# Patient Record
Sex: Female | Born: 1935 | Race: White | Hispanic: No | Marital: Married | Smoking: Former smoker
Health system: Southern US, Community
[De-identification: ages and names within clinical notes are randomized; demographics above are authoritative.]

## PROBLEM LIST (undated history)

## (undated) DIAGNOSIS — I519 Heart disease, unspecified: Secondary | ICD-10-CM

## (undated) DIAGNOSIS — M1990 Unspecified osteoarthritis, unspecified site: Secondary | ICD-10-CM

## (undated) DIAGNOSIS — I219 Acute myocardial infarction, unspecified: Secondary | ICD-10-CM

## (undated) DIAGNOSIS — C50311 Malignant neoplasm of lower-inner quadrant of right female breast: Secondary | ICD-10-CM

## (undated) DIAGNOSIS — E119 Type 2 diabetes mellitus without complications: Secondary | ICD-10-CM

## (undated) DIAGNOSIS — N3090 Cystitis, unspecified without hematuria: Secondary | ICD-10-CM

## (undated) DIAGNOSIS — I10 Essential (primary) hypertension: Secondary | ICD-10-CM

## (undated) DIAGNOSIS — I209 Angina pectoris, unspecified: Secondary | ICD-10-CM

## (undated) DIAGNOSIS — C801 Malignant (primary) neoplasm, unspecified: Secondary | ICD-10-CM

## (undated) DIAGNOSIS — J45909 Unspecified asthma, uncomplicated: Secondary | ICD-10-CM

## (undated) HISTORY — DX: Malignant (primary) neoplasm, unspecified: C80.1

## (undated) HISTORY — DX: Unspecified osteoarthritis, unspecified site: M19.90

## (undated) HISTORY — DX: Unspecified asthma, uncomplicated: J45.909

## (undated) HISTORY — DX: Malignant neoplasm of lower-inner quadrant of right female breast: C50.311

## (undated) HISTORY — DX: Cystitis, unspecified without hematuria: N30.90

## (undated) HISTORY — DX: Heart disease, unspecified: I51.9

## (undated) HISTORY — PX: ABDOMINAL HYSTERECTOMY: SHX81

## (undated) HISTORY — DX: Acute myocardial infarction, unspecified: I21.9

## (undated) HISTORY — DX: Angina pectoris, unspecified: I20.9

## (undated) HISTORY — DX: Essential (primary) hypertension: I10

## (undated) HISTORY — DX: Type 2 diabetes mellitus without complications: E11.9

---

## 1999-04-05 HISTORY — PX: COLONOSCOPY: SHX174

## 2003-03-12 ENCOUNTER — Other Ambulatory Visit: Payer: Self-pay

## 2003-03-14 ENCOUNTER — Other Ambulatory Visit: Payer: Self-pay

## 2003-03-16 ENCOUNTER — Other Ambulatory Visit: Payer: Self-pay

## 2003-11-10 ENCOUNTER — Other Ambulatory Visit: Payer: Self-pay

## 2004-01-19 ENCOUNTER — Other Ambulatory Visit: Payer: Self-pay

## 2004-01-19 ENCOUNTER — Inpatient Hospital Stay: Payer: Self-pay | Admitting: Cardiology

## 2004-04-04 ENCOUNTER — Emergency Department: Payer: Self-pay | Admitting: Emergency Medicine

## 2004-04-04 DIAGNOSIS — I519 Heart disease, unspecified: Secondary | ICD-10-CM

## 2004-04-04 HISTORY — PX: AORTIC VALVE SURGERY: SHX549

## 2004-04-04 HISTORY — DX: Heart disease, unspecified: I51.9

## 2004-04-04 HISTORY — PX: CORONARY ANGIOPLASTY WITH STENT PLACEMENT: SHX49

## 2004-12-23 ENCOUNTER — Inpatient Hospital Stay: Payer: Self-pay | Admitting: Endocrinology

## 2004-12-23 ENCOUNTER — Other Ambulatory Visit: Payer: Self-pay

## 2004-12-24 ENCOUNTER — Other Ambulatory Visit: Payer: Self-pay

## 2005-07-17 ENCOUNTER — Other Ambulatory Visit: Payer: Self-pay

## 2005-07-17 ENCOUNTER — Emergency Department: Payer: Self-pay | Admitting: Unknown Physician Specialty

## 2005-09-15 ENCOUNTER — Emergency Department: Payer: Self-pay | Admitting: Emergency Medicine

## 2005-09-15 ENCOUNTER — Other Ambulatory Visit: Payer: Self-pay

## 2005-12-24 ENCOUNTER — Emergency Department: Payer: Self-pay | Admitting: Emergency Medicine

## 2005-12-24 ENCOUNTER — Other Ambulatory Visit: Payer: Self-pay

## 2006-01-01 ENCOUNTER — Emergency Department: Payer: Self-pay | Admitting: Emergency Medicine

## 2006-01-08 ENCOUNTER — Inpatient Hospital Stay: Payer: Self-pay | Admitting: Endocrinology

## 2006-01-23 ENCOUNTER — Ambulatory Visit: Payer: Self-pay | Admitting: Urology

## 2006-03-05 ENCOUNTER — Observation Stay: Payer: Self-pay | Admitting: Specialist

## 2006-03-05 ENCOUNTER — Other Ambulatory Visit: Payer: Self-pay

## 2007-01-01 ENCOUNTER — Ambulatory Visit: Payer: Self-pay

## 2007-01-13 ENCOUNTER — Emergency Department: Payer: Self-pay

## 2007-03-06 ENCOUNTER — Ambulatory Visit: Payer: Self-pay | Admitting: Gastroenterology

## 2007-03-20 ENCOUNTER — Observation Stay: Payer: Self-pay | Admitting: Gastroenterology

## 2007-04-05 DIAGNOSIS — I209 Angina pectoris, unspecified: Secondary | ICD-10-CM

## 2007-04-05 HISTORY — DX: Angina pectoris, unspecified: I20.9

## 2007-10-06 IMAGING — CT CT CHEST W/ CM
2 series · 15 of 31 positions shown, 19 images · IV contrast (APPLIED)
Comparison: none

REASON FOR EXAM: Pain in ribs
COMMENTS:  LMP: Post-Menopausal

PROCEDURE:     CT  - CT CHEST (FOR PE) W  - July 17, 2005  [DATE]
RESULT:     The patient is experiencing pain in ribs.
TECHNIQUE: CT scan of chest is performed with contrast.
Comparison is made to prior chest CT of 12/24/2004.

[Series 4: soft tissue · axial · 0.67mm/px · z∈[+562,+601]mm · 2 of 87 slices shown]
[im 7/87  mediastinal]
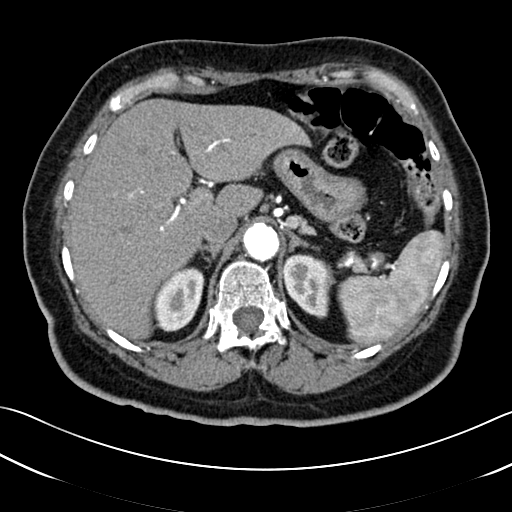
[im 20/87  mediastinal]
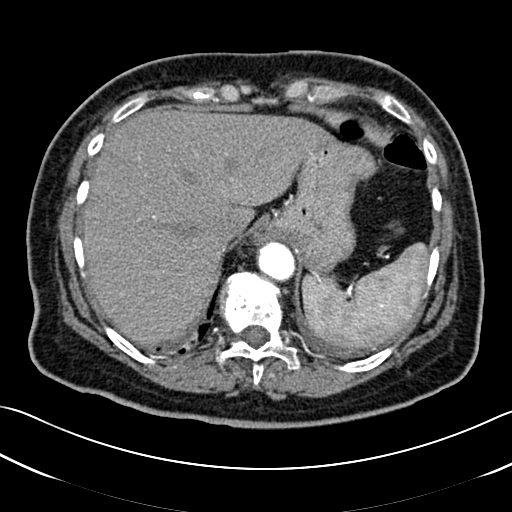

[Series 5: lung windows · axial · 0.67mm/px · z∈[+568,+781]mm · 13 of 85 slices shown, 17 images]
[im 7/85  mediastinal]
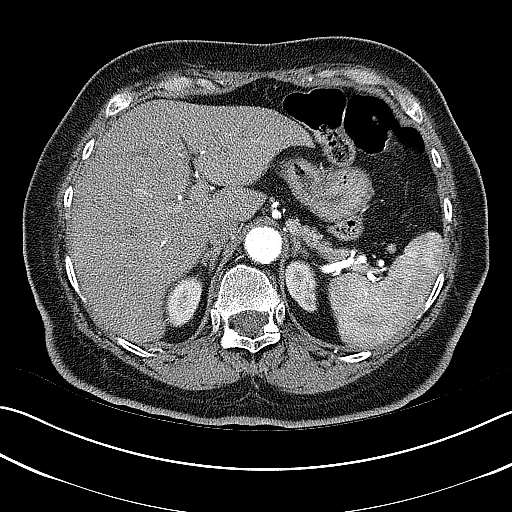
[im 7/85  lung]
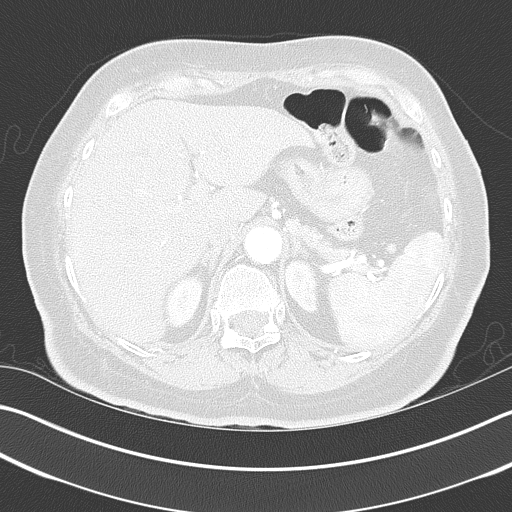
[im 13/85  lung]
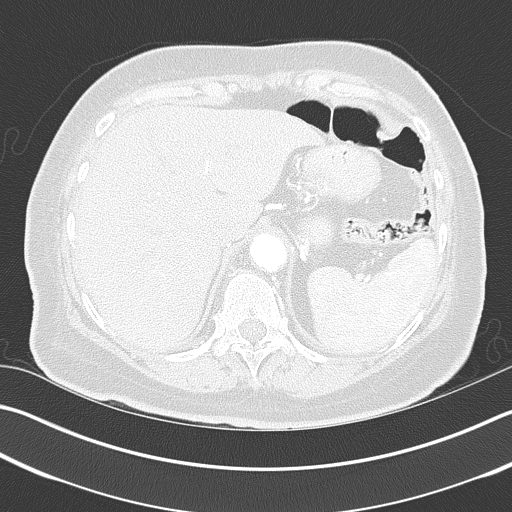
[im 20/85  lung]
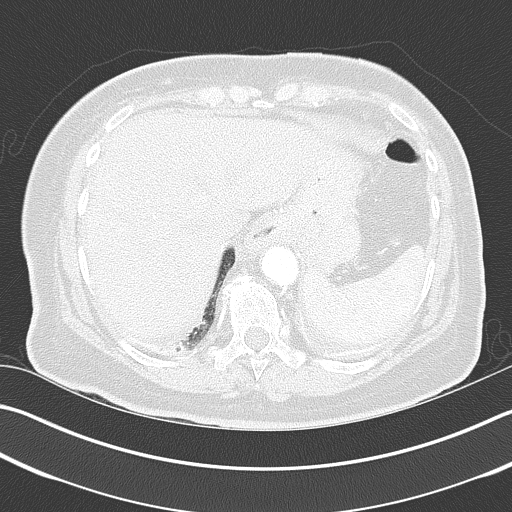
[im 26/85  lung]
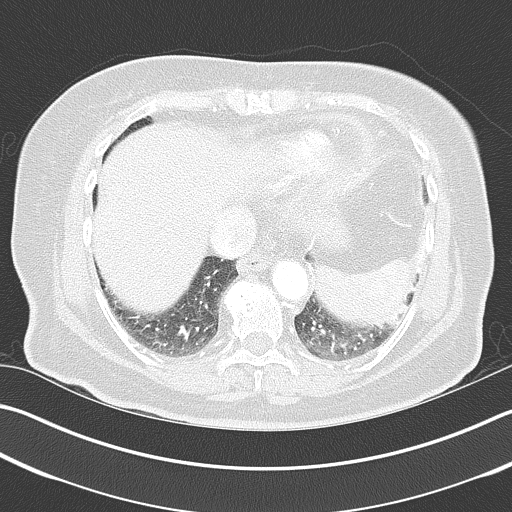
[im 33/85  mediastinal]
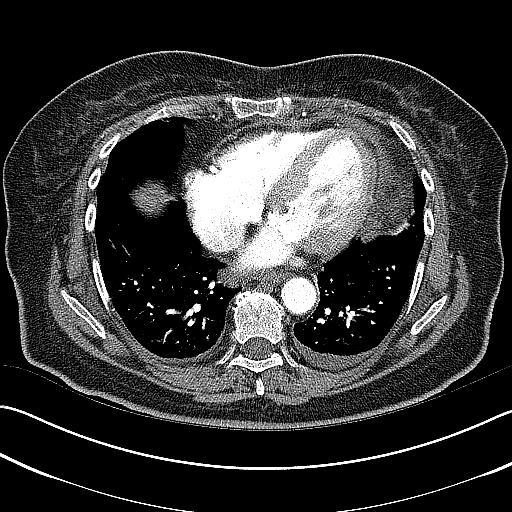
[im 33/85  lung]
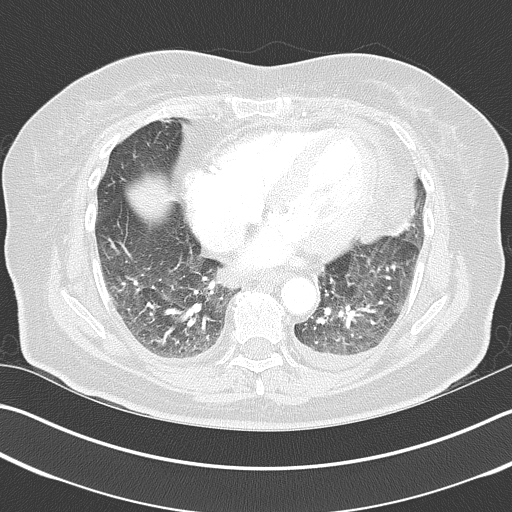
[im 39/85  lung]
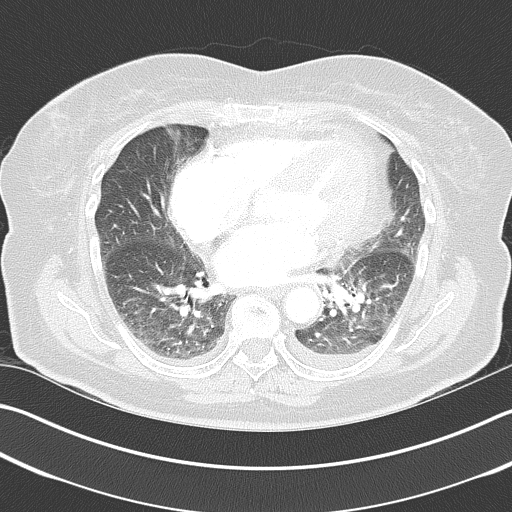
[im 43/85  lung]
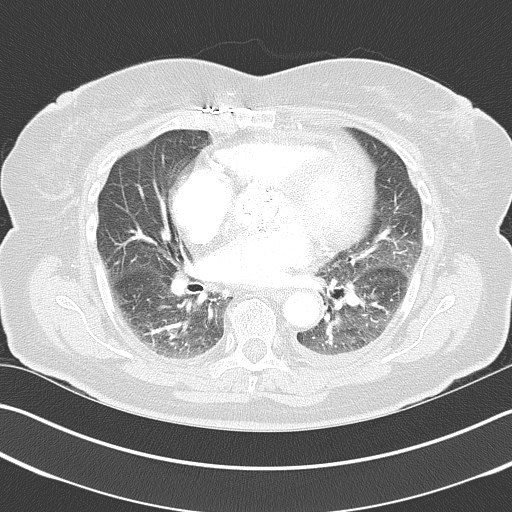
[im 46/85  lung]
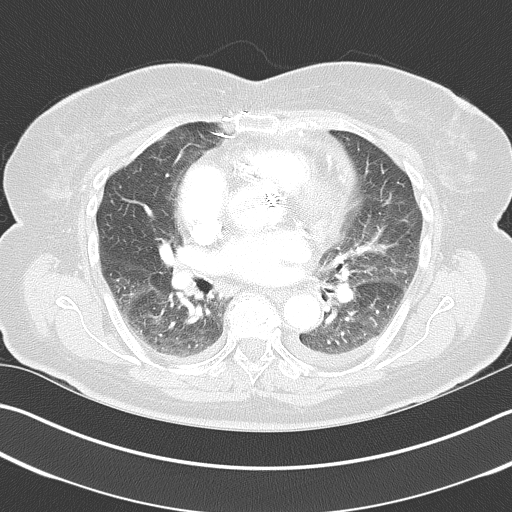
[im 52/85  mediastinal]
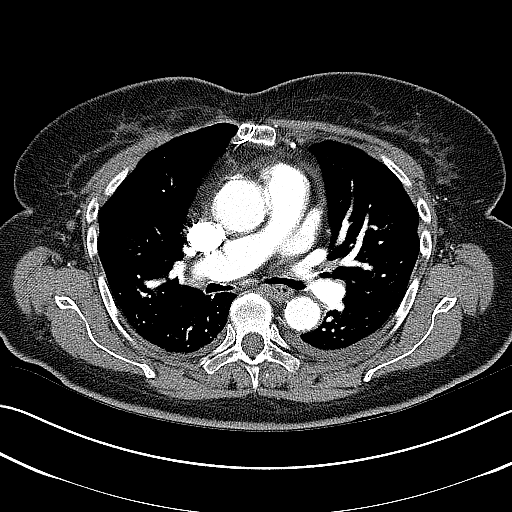
[im 52/85  lung]
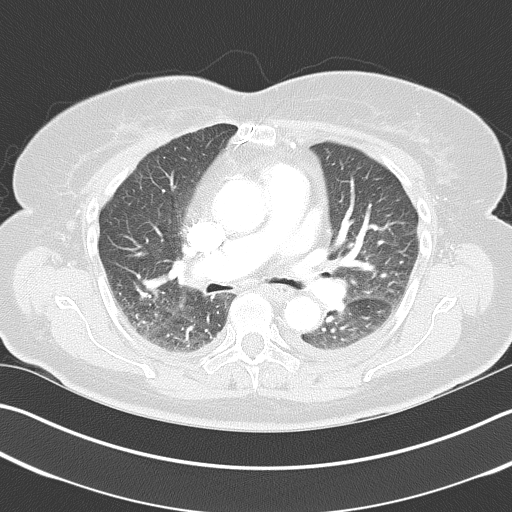
[im 59/85  lung]
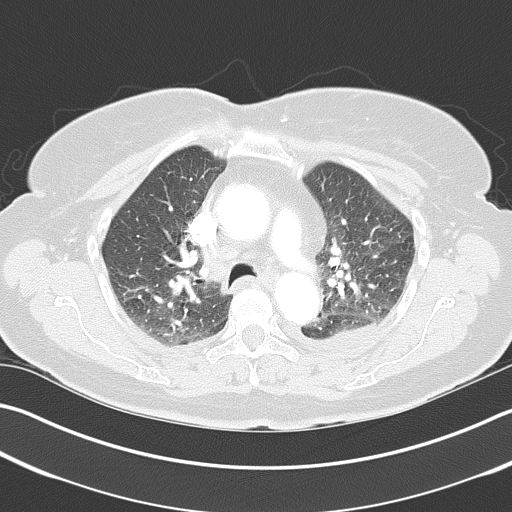
[im 65/85  lung]
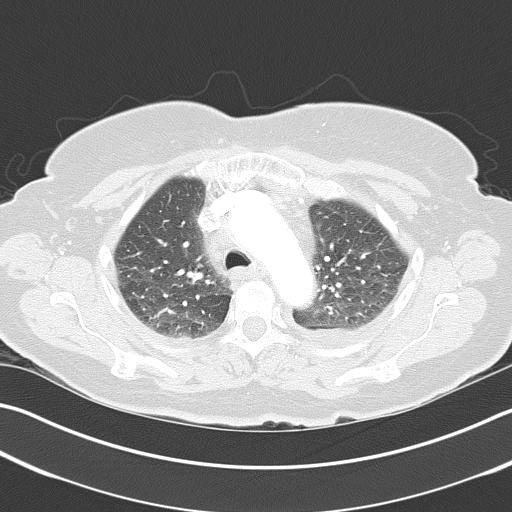
[im 72/85  lung]
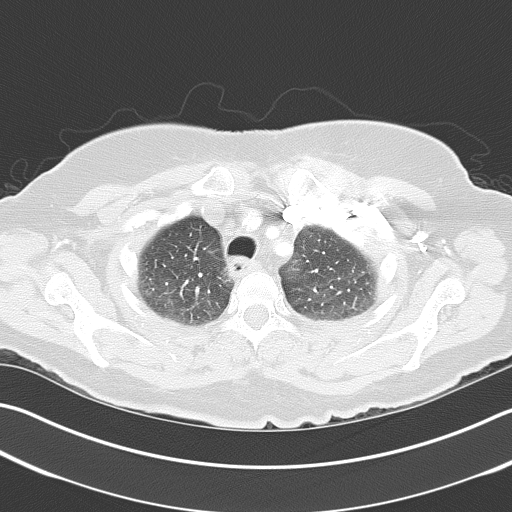
[im 78/85  mediastinal]
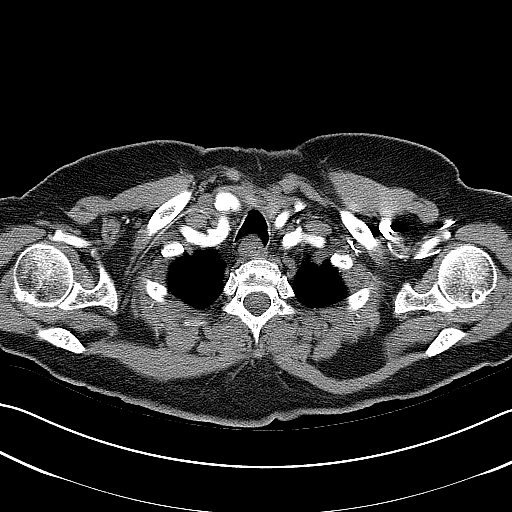
[im 78/85  lung]
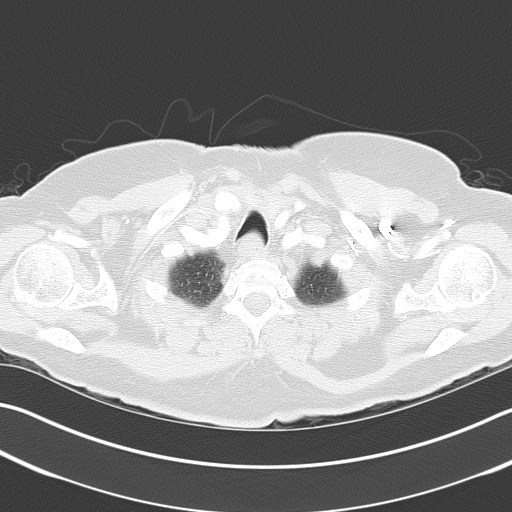

[15 of 31 positions shown; findings below may reference images not displayed]

FINDINGS: Multiple, stable, small mediastinal lymph nodes are noted. There
is borderline cardiomegaly with coronary artery calcification. The pulmonary
arteries are intact. There is no evidence of pulmonary embolus. The thoracic
aorta is normal. Small, bilateral pleural effusions are present. The patient
has had aortic valve replacement. Mild pulmonary interstitial prominence is
noted. This is new. These changes are most consistent with congestive heart
failure. An infectious or inflammatory interstitial process cannot be
excluded.
IMPRESSION: 1.     No evidence of pulmonary embolus.
2.     Changes most consistent with congestive heart failure with mild
interstitial edema and bilateral pleural effusions.
3.     The patient has had prior aortic valve replacement. Coronary artery
disease is present.

## 2007-12-05 IMAGING — CR DG CHEST 1V PORT
1 series · 1 of 1 positions shown · non-contrast
Comparison: none

REASON FOR EXAM: chest pain//RM4
COMMENTS:

PROCEDURE:     DXR - DXR PORTABLE CHEST SINGLE VIEW  - September 15, 2005  [DATE]
RESULT:     AP view of the chest shows the lung fields to be clear. No
pneumonia, pneumothorax or pleural effusion is seen. Heart size is within
normal limits.  Postoperative changes of prior CABG are noted.

[view not recorded]
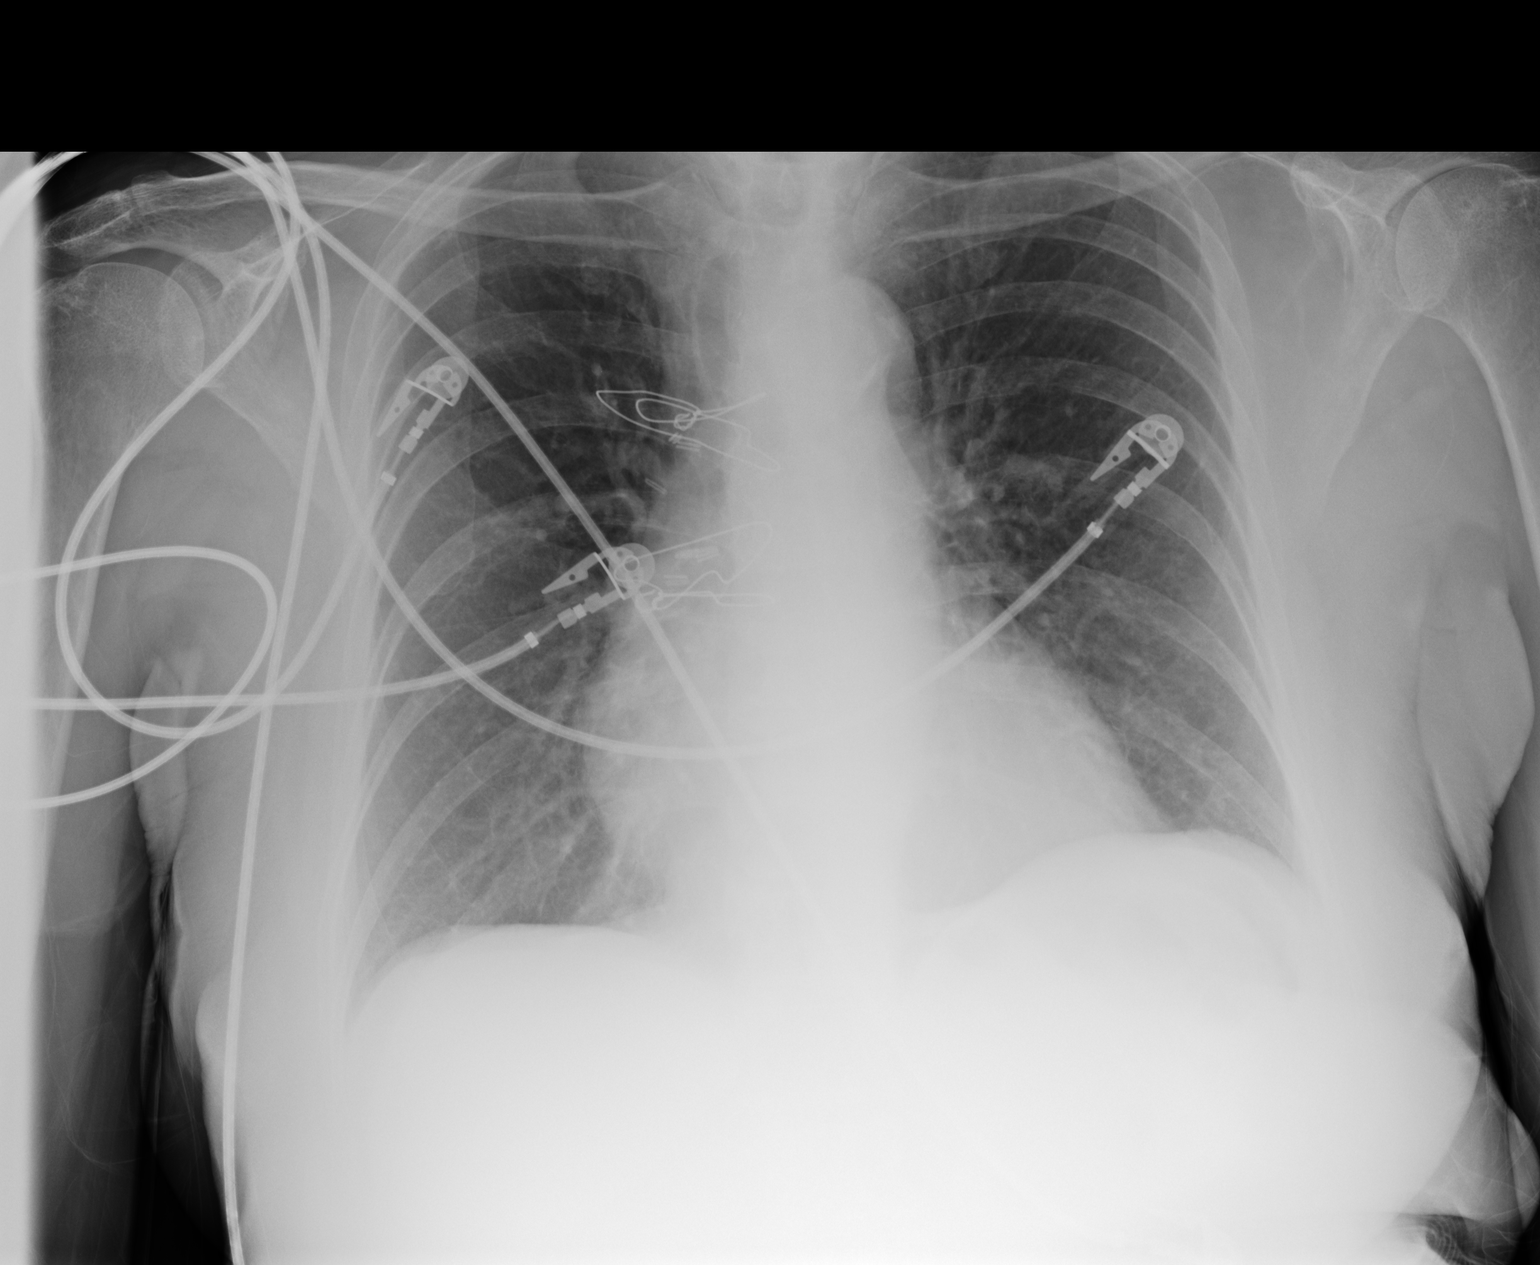

[1 of 1 positions shown; findings below may reference images not displayed]

IMPRESSION: 1)No acute changes are identified.

## 2007-12-07 ENCOUNTER — Ambulatory Visit: Payer: Self-pay | Admitting: Unknown Physician Specialty

## 2008-04-09 ENCOUNTER — Ambulatory Visit: Payer: Self-pay | Admitting: Unknown Physician Specialty

## 2008-06-13 ENCOUNTER — Emergency Department: Payer: Self-pay | Admitting: Internal Medicine

## 2008-07-13 ENCOUNTER — Emergency Department: Payer: Self-pay | Admitting: Emergency Medicine

## 2008-12-05 ENCOUNTER — Emergency Department: Payer: Self-pay | Admitting: Emergency Medicine

## 2009-01-27 ENCOUNTER — Inpatient Hospital Stay: Payer: Self-pay | Admitting: Internal Medicine

## 2009-03-09 ENCOUNTER — Ambulatory Visit: Payer: Self-pay | Admitting: Internal Medicine

## 2009-04-04 DIAGNOSIS — C50311 Malignant neoplasm of lower-inner quadrant of right female breast: Secondary | ICD-10-CM

## 2009-04-04 DIAGNOSIS — C801 Malignant (primary) neoplasm, unspecified: Secondary | ICD-10-CM

## 2009-04-04 HISTORY — DX: Malignant (primary) neoplasm, unspecified: C80.1

## 2009-04-04 HISTORY — DX: Malignant neoplasm of lower-inner quadrant of right female breast: C50.311

## 2009-04-22 ENCOUNTER — Inpatient Hospital Stay: Payer: Self-pay | Admitting: Internal Medicine

## 2009-07-28 ENCOUNTER — Emergency Department: Payer: Self-pay | Admitting: Emergency Medicine

## 2009-08-31 ENCOUNTER — Ambulatory Visit: Payer: Self-pay | Admitting: General Surgery

## 2009-09-02 ENCOUNTER — Ambulatory Visit: Payer: Self-pay | Admitting: Oncology

## 2009-09-02 HISTORY — PX: BREAST SURGERY: SHX581

## 2009-09-03 ENCOUNTER — Ambulatory Visit: Payer: Self-pay | Admitting: General Surgery

## 2009-09-10 ENCOUNTER — Ambulatory Visit: Payer: Self-pay | Admitting: Oncology

## 2009-09-23 ENCOUNTER — Ambulatory Visit: Payer: Self-pay | Admitting: General Surgery

## 2009-10-02 ENCOUNTER — Ambulatory Visit: Payer: Self-pay | Admitting: Oncology

## 2009-10-06 ENCOUNTER — Inpatient Hospital Stay: Payer: Self-pay | Admitting: General Surgery

## 2009-11-02 ENCOUNTER — Ambulatory Visit: Payer: Self-pay | Admitting: Oncology

## 2009-11-04 ENCOUNTER — Ambulatory Visit: Payer: Self-pay | Admitting: Oncology

## 2009-11-22 ENCOUNTER — Inpatient Hospital Stay: Payer: Self-pay | Admitting: *Deleted

## 2009-12-03 ENCOUNTER — Ambulatory Visit: Payer: Self-pay | Admitting: Oncology

## 2010-02-12 ENCOUNTER — Ambulatory Visit: Payer: Self-pay | Admitting: Oncology

## 2010-02-13 LAB — CANCER ANTIGEN 27.29: CA 27.29: 18.3 U/mL (ref 0.0–38.6)

## 2010-03-04 ENCOUNTER — Ambulatory Visit: Payer: Self-pay | Admitting: Oncology

## 2010-08-29 ENCOUNTER — Emergency Department: Payer: Self-pay | Admitting: Internal Medicine

## 2011-02-02 ENCOUNTER — Ambulatory Visit: Payer: Self-pay | Admitting: Ophthalmology

## 2011-02-02 DIAGNOSIS — I499 Cardiac arrhythmia, unspecified: Secondary | ICD-10-CM

## 2011-02-16 ENCOUNTER — Ambulatory Visit: Payer: Self-pay | Admitting: Ophthalmology

## 2011-03-31 ENCOUNTER — Inpatient Hospital Stay: Payer: Self-pay | Admitting: Internal Medicine

## 2011-06-05 ENCOUNTER — Inpatient Hospital Stay: Payer: Self-pay | Admitting: *Deleted

## 2011-06-05 LAB — CBC
MCHC: 30.4 g/dL — ABNORMAL LOW (ref 32.0–36.0)
Platelet: 183 10*3/uL (ref 150–440)
RBC: 4.46 10*6/uL (ref 3.80–5.20)
RDW: 18.1 % — ABNORMAL HIGH (ref 11.5–14.5)
WBC: 9.4 10*3/uL (ref 3.6–11.0)

## 2011-06-05 LAB — URINALYSIS, COMPLETE
Bacteria: NONE SEEN
Blood: NEGATIVE
Nitrite: NEGATIVE
Ph: 5 (ref 4.5–8.0)

## 2011-06-05 LAB — COMPREHENSIVE METABOLIC PANEL
Albumin: 4.7 g/dL (ref 3.4–5.0)
Anion Gap: 21 — ABNORMAL HIGH (ref 7–16)
Bilirubin,Total: 0.8 mg/dL (ref 0.2–1.0)
Calcium, Total: 10 mg/dL (ref 8.5–10.1)
Chloride: 98 mmol/L (ref 98–107)
Co2: 17 mmol/L — ABNORMAL LOW (ref 21–32)
Osmolality: 311 (ref 275–301)
Potassium: 4.8 mmol/L (ref 3.5–5.1)
Sodium: 136 mmol/L (ref 136–145)
Total Protein: 8.6 g/dL — ABNORMAL HIGH (ref 6.4–8.2)

## 2011-06-05 LAB — TROPONIN I: Troponin-I: 0.08 ng/mL — ABNORMAL HIGH

## 2011-06-05 LAB — LIPASE, BLOOD: Lipase: 33 U/L — ABNORMAL LOW (ref 73–393)

## 2011-06-05 LAB — HEMOGLOBIN A1C: Hemoglobin A1C: 9.6 % — ABNORMAL HIGH (ref 4.2–6.3)

## 2011-06-06 LAB — BASIC METABOLIC PANEL
BUN: 27 mg/dL — ABNORMAL HIGH (ref 7–18)
Calcium, Total: 9.4 mg/dL (ref 8.5–10.1)
Calcium, Total: 9.3 mg/dL (ref 8.5–10.1)
Co2: 20 mmol/L — ABNORMAL LOW (ref 21–32)
Co2: 23 mmol/L (ref 21–32)
Creatinine: 1.06 mg/dL (ref 0.60–1.30)
EGFR (African American): >60
EGFR (Non-African Amer.): >60
Osmolality: 302 (ref 275–301)
Potassium: 3.8 mmol/L (ref 3.5–5.1)
Potassium: 3.4 mmol/L — ABNORMAL LOW (ref 3.5–5.1)
Sodium: 147 mmol/L — ABNORMAL HIGH (ref 136–145)
Sodium: 148 mmol/L — ABNORMAL HIGH (ref 136–145)

## 2011-06-06 LAB — CBC WITH DIFFERENTIAL/PLATELET
Basophil #: 0 10*3/uL (ref 0.0–0.1)
Basophil %: 0.4 %
Eosinophil %: 0 %
HGB: 9.3 g/dL — ABNORMAL LOW (ref 12.0–16.0)
Lymphocyte %: 7.5 %
MCH: 23.1 pg — ABNORMAL LOW (ref 26.0–34.0)
Monocyte %: 11.4 %
Platelet: 180 10*3/uL (ref 150–440)
RDW: 17.8 % — ABNORMAL HIGH (ref 11.5–14.5)
WBC: 7.6 10*3/uL (ref 3.6–11.0)

## 2011-06-06 LAB — MAGNESIUM: Magnesium: 2.1 mg/dL

## 2011-06-07 LAB — BASIC METABOLIC PANEL
Anion Gap: 15 (ref 7–16)
BUN: 26 mg/dL — ABNORMAL HIGH (ref 7–18)
Chloride: 112 mmol/L — ABNORMAL HIGH (ref 98–107)
Co2: 20 mmol/L — ABNORMAL LOW (ref 21–32)
EGFR (African American): >60
Glucose: 182 mg/dL — ABNORMAL HIGH (ref 65–99)
Osmolality: 302 (ref 275–301)
Potassium: 3.8 mmol/L (ref 3.5–5.1)

## 2011-06-07 LAB — HEPATIC FUNCTION PANEL A (ARMC)
Albumin: 3.3 g/dL — ABNORMAL LOW (ref 3.4–5.0)
Alkaline Phosphatase: 67 U/L (ref 50–136)
Bilirubin,Total: 0.5 mg/dL (ref 0.2–1.0)
SGPT (ALT): 17 U/L
Total Protein: 6.7 g/dL (ref 6.4–8.2)

## 2011-06-07 LAB — CBC WITH DIFFERENTIAL/PLATELET
Basophil #: 0 10*3/uL (ref 0.0–0.1)
Eosinophil %: 0.5 %
HGB: 8.9 g/dL — ABNORMAL LOW (ref 12.0–16.0)
Lymphocyte #: 1.2 10*3/uL (ref 1.0–3.6)
MCH: 23 pg — ABNORMAL LOW (ref 26.0–34.0)
MCV: 74 fL — ABNORMAL LOW (ref 80–100)
Monocyte #: 0.4 10*3/uL (ref 0.0–0.7)
Platelet: 150 10*3/uL (ref 150–440)
RDW: 18.2 % — ABNORMAL HIGH (ref 11.5–14.5)
WBC: 6.3 10*3/uL (ref 3.6–11.0)

## 2011-06-08 LAB — BASIC METABOLIC PANEL
Anion Gap: 12 (ref 7–16)
Calcium, Total: 8.4 mg/dL — ABNORMAL LOW (ref 8.5–10.1)
Chloride: 108 mmol/L — ABNORMAL HIGH (ref 98–107)
Co2: 24 mmol/L (ref 21–32)
Creatinine: 0.61 mg/dL (ref 0.60–1.30)
EGFR (African American): >60
EGFR (Non-African Amer.): >60

## 2011-06-08 LAB — TROPONIN I: Troponin-I: 2.1 ng/mL — ABNORMAL HIGH

## 2011-06-08 LAB — HEMOGLOBIN: HGB: 8.9 g/dL — ABNORMAL LOW (ref 12.0–16.0)

## 2011-08-16 ENCOUNTER — Ambulatory Visit: Payer: Self-pay | Admitting: Oncology

## 2011-08-23 LAB — CBC CANCER CENTER
Basophil #: 0 x10 3/mm (ref 0.0–0.1)
Eosinophil #: 0.1 x10 3/mm (ref 0.0–0.7)
Eosinophil %: 1.8 %
HCT: 34.5 % — ABNORMAL LOW (ref 35.0–47.0)
Lymphocyte #: 0.7 x10 3/mm — ABNORMAL LOW (ref 1.0–3.6)
Lymphocyte %: 16.1 %
MCH: 25.1 pg — ABNORMAL LOW (ref 26.0–34.0)
Monocyte #: 0.4 x10 3/mm (ref 0.2–0.9)
Neutrophil #: 3.4 x10 3/mm (ref 1.4–6.5)
Neutrophil %: 73.1 %
Platelet: 147 x10 3/mm — ABNORMAL LOW (ref 150–440)
RBC: 4.39 10*6/uL (ref 3.80–5.20)
RDW: 19.5 % — ABNORMAL HIGH (ref 11.5–14.5)
WBC: 4.6 x10 3/mm (ref 3.6–11.0)

## 2011-08-23 LAB — COMPREHENSIVE METABOLIC PANEL
Alkaline Phosphatase: 81 U/L (ref 50–136)
BUN: 14 mg/dL (ref 7–18)
Bilirubin,Total: 0.4 mg/dL (ref 0.2–1.0)
Chloride: 101 mmol/L (ref 98–107)
Co2: 27 mmol/L (ref 21–32)
Creatinine: 0.74 mg/dL (ref 0.60–1.30)
EGFR (African American): >60
Glucose: 315 mg/dL — ABNORMAL HIGH (ref 65–99)
SGOT(AST): 20 U/L (ref 15–37)
Sodium: 137 mmol/L (ref 136–145)
Total Protein: 7.3 g/dL (ref 6.4–8.2)

## 2011-08-24 LAB — CANCER ANTIGEN 27.29: CA 27.29: 14.5 U/mL (ref 0.0–38.6)

## 2011-09-03 ENCOUNTER — Ambulatory Visit: Payer: Self-pay | Admitting: Oncology

## 2011-10-03 ENCOUNTER — Ambulatory Visit: Payer: Self-pay | Admitting: Oncology

## 2011-10-04 LAB — CANCER CENTER HEMOGLOBIN: HGB: 11 g/dL — ABNORMAL LOW (ref 12.0–16.0)

## 2011-11-03 ENCOUNTER — Ambulatory Visit: Payer: Self-pay | Admitting: Oncology

## 2011-12-07 ENCOUNTER — Emergency Department: Payer: Self-pay

## 2011-12-07 LAB — CBC WITH DIFFERENTIAL/PLATELET
Basophil #: 0.1 10*3/uL (ref 0.0–0.1)
Basophil %: 1.1 %
Eosinophil #: 0.1 10*3/uL (ref 0.0–0.7)
Eosinophil %: 1.3 %
HCT: 29.1 % — ABNORMAL LOW (ref 35.0–47.0)
HGB: 9.8 g/dL — ABNORMAL LOW (ref 12.0–16.0)
Lymphocyte #: 1.1 10*3/uL (ref 1.0–3.6)
MCH: 27.5 pg (ref 26.0–34.0)
MCV: 82 fL (ref 80–100)
Monocyte %: 15.4 %
Neutrophil #: 3.9 10*3/uL (ref 1.4–6.5)
Platelet: 172 10*3/uL (ref 150–440)
RBC: 3.56 10*6/uL — ABNORMAL LOW (ref 3.80–5.20)
WBC: 6.1 10*3/uL (ref 3.6–11.0)

## 2011-12-07 LAB — BASIC METABOLIC PANEL
Calcium, Total: 9.2 mg/dL (ref 8.5–10.1)
Co2: 27 mmol/L (ref 21–32)
EGFR (Non-African Amer.): >60
Potassium: 4.7 mmol/L (ref 3.5–5.1)
Sodium: 136 mmol/L (ref 136–145)

## 2012-02-03 ENCOUNTER — Emergency Department: Payer: Self-pay | Admitting: Emergency Medicine

## 2012-02-03 LAB — CBC WITH DIFFERENTIAL/PLATELET
Basophil #: 0.1 10*3/uL (ref 0.0–0.1)
Basophil %: 1.9 %
Eosinophil #: 0.1 10*3/uL (ref 0.0–0.7)
HCT: 27 % — ABNORMAL LOW (ref 35.0–47.0)
Lymphocyte %: 23.3 %
MCHC: 31.9 g/dL — ABNORMAL LOW (ref 32.0–36.0)
Monocyte %: 16 %
Neutrophil #: 2 10*3/uL (ref 1.4–6.5)
Neutrophil %: 56.6 %
Platelet: 189 10*3/uL (ref 150–440)
RBC: 3.85 10*6/uL (ref 3.80–5.20)
RDW: 16.7 % — ABNORMAL HIGH (ref 11.5–14.5)
WBC: 3.5 10*3/uL — ABNORMAL LOW (ref 3.6–11.0)

## 2012-02-03 LAB — COMPREHENSIVE METABOLIC PANEL
Albumin: 3.8 g/dL (ref 3.4–5.0)
Alkaline Phosphatase: 72 U/L (ref 50–136)
Anion Gap: 9 (ref 7–16)
BUN: 10 mg/dL (ref 7–18)
Bilirubin,Total: 0.4 mg/dL (ref 0.2–1.0)
Calcium, Total: 8.9 mg/dL (ref 8.5–10.1)
Chloride: 108 mmol/L — ABNORMAL HIGH (ref 98–107)
Co2: 24 mmol/L (ref 21–32)
Creatinine: 0.66 mg/dL (ref 0.60–1.30)
EGFR (Non-African Amer.): >60
Glucose: 97 mg/dL (ref 65–99)
Osmolality: 280 (ref 275–301)
Potassium: 4 mmol/L (ref 3.5–5.1)
SGPT (ALT): 22 U/L (ref 12–78)
Sodium: 141 mmol/L (ref 136–145)

## 2012-02-03 LAB — PROTIME-INR: Prothrombin Time: 12.3 secs (ref 11.5–14.7)

## 2012-02-03 LAB — APTT: Activated PTT: 32.3 secs (ref 23.6–35.9)

## 2012-02-14 ENCOUNTER — Ambulatory Visit: Payer: Self-pay | Admitting: Oncology

## 2012-02-14 LAB — CBC CANCER CENTER
Basophil #: 0 x10 3/mm (ref 0.0–0.1)
Basophil %: 1.3 %
Eosinophil %: 2.4 %
HGB: 8.6 g/dL — ABNORMAL LOW (ref 12.0–16.0)
Lymphocyte %: 25.2 %
MCV: 69 fL — ABNORMAL LOW (ref 80–100)
Monocyte %: 16.2 %
Neutrophil %: 54.9 %
Platelet: 189 x10 3/mm (ref 150–440)
RBC: 4.17 10*6/uL (ref 3.80–5.20)
RDW: 17.3 % — ABNORMAL HIGH (ref 11.5–14.5)
WBC: 3.3 x10 3/mm — ABNORMAL LOW (ref 3.6–11.0)

## 2012-02-14 LAB — IRON AND TIBC
Iron Bind.Cap.(Total): 454 ug/dL — ABNORMAL HIGH (ref 250–450)
Iron Saturation: 3 %
Iron: 15 ug/dL — ABNORMAL LOW (ref 50–170)
Unbound Iron-Bind.Cap.: 439 ug/dL

## 2012-02-28 LAB — CBC CANCER CENTER
Basophil %: 0.7 %
Eosinophil %: 2.1 %
HCT: 30.7 % — ABNORMAL LOW (ref 35.0–47.0)
HGB: 10 g/dL — ABNORMAL LOW (ref 12.0–16.0)
Lymphocyte #: 0.7 x10 3/mm — ABNORMAL LOW (ref 1.0–3.6)
Lymphocyte %: 16.2 %
MCV: 73 fL — ABNORMAL LOW (ref 80–100)
Monocyte %: 8.9 %
Neutrophil #: 3.2 x10 3/mm (ref 1.4–6.5)
Neutrophil %: 72.1 %
RBC: 4.23 10*6/uL (ref 3.80–5.20)
RDW: 25.8 % — ABNORMAL HIGH (ref 11.5–14.5)
WBC: 4.4 x10 3/mm (ref 3.6–11.0)

## 2012-03-04 ENCOUNTER — Ambulatory Visit: Payer: Self-pay | Admitting: Oncology

## 2012-03-23 ENCOUNTER — Emergency Department: Payer: Self-pay | Admitting: Emergency Medicine

## 2012-03-23 LAB — URINALYSIS, COMPLETE
Bacteria: NONE SEEN
Blood: NEGATIVE
Nitrite: NEGATIVE
Protein: NEGATIVE
Specific Gravity: 1.008 (ref 1.003–1.030)
WBC UR: 182 /HPF (ref 0–5)

## 2012-03-23 LAB — CBC
HCT: 36.8 % (ref 35.0–47.0)
MCH: 24.7 pg — ABNORMAL LOW (ref 26.0–34.0)
MCHC: 32.6 g/dL (ref 32.0–36.0)
Platelet: 161 10*3/uL (ref 150–440)
RBC: 4.86 10*6/uL (ref 3.80–5.20)
RDW: 27.8 % — ABNORMAL HIGH (ref 11.5–14.5)
WBC: 12.3 10*3/uL — ABNORMAL HIGH (ref 3.6–11.0)

## 2012-03-23 LAB — COMPREHENSIVE METABOLIC PANEL
BUN: 17 mg/dL (ref 7–18)
Bilirubin,Total: 0.6 mg/dL (ref 0.2–1.0)
Chloride: 105 mmol/L (ref 98–107)
Co2: 25 mmol/L (ref 21–32)
Creatinine: 0.77 mg/dL (ref 0.60–1.30)
EGFR (African American): >60
EGFR (Non-African Amer.): >60
Osmolality: 284 (ref 275–301)
Potassium: 3.6 mmol/L (ref 3.5–5.1)
SGPT (ALT): 26 U/L (ref 12–78)
Sodium: 140 mmol/L (ref 136–145)
Total Protein: 7.7 g/dL (ref 6.4–8.2)

## 2012-03-23 LAB — LIPASE, BLOOD: Lipase: 50 U/L — ABNORMAL LOW (ref 73–393)

## 2012-04-04 ENCOUNTER — Ambulatory Visit: Payer: Self-pay | Admitting: Oncology

## 2012-04-04 DIAGNOSIS — I219 Acute myocardial infarction, unspecified: Secondary | ICD-10-CM

## 2012-04-04 HISTORY — DX: Acute myocardial infarction, unspecified: I21.9

## 2012-04-11 LAB — CBC CANCER CENTER
Basophil #: 0.1 x10 3/mm (ref 0.0–0.1)
Eosinophil #: 0.1 x10 3/mm (ref 0.0–0.7)
HCT: 40.5 % (ref 35.0–47.0)
Lymphocyte #: 0.9 x10 3/mm — ABNORMAL LOW (ref 1.0–3.6)
Lymphocyte %: 25 %
MCV: 78 fL — ABNORMAL LOW (ref 80–100)
Monocyte %: 8.2 %
Neutrophil %: 63.3 %
Platelet: 131 x10 3/mm — ABNORMAL LOW (ref 150–440)
RBC: 5.21 10*6/uL — ABNORMAL HIGH (ref 3.80–5.20)
RDW: 26.4 % — ABNORMAL HIGH (ref 11.5–14.5)
WBC: 3.8 x10 3/mm (ref 3.6–11.0)

## 2012-04-11 LAB — IRON AND TIBC
Iron: 69 ug/dL (ref 50–170)
Unbound Iron-Bind.Cap.: 309 ug/dL

## 2012-04-11 LAB — FERRITIN: Ferritin (ARMC): 68 ng/mL (ref 8–388)

## 2012-05-05 ENCOUNTER — Ambulatory Visit: Payer: Self-pay | Admitting: Oncology

## 2012-05-10 ENCOUNTER — Emergency Department: Payer: Self-pay | Admitting: Internal Medicine

## 2012-05-11 ENCOUNTER — Encounter: Payer: Self-pay | Admitting: *Deleted

## 2012-05-11 DIAGNOSIS — J45909 Unspecified asthma, uncomplicated: Secondary | ICD-10-CM | POA: Insufficient documentation

## 2012-05-11 DIAGNOSIS — I219 Acute myocardial infarction, unspecified: Secondary | ICD-10-CM | POA: Insufficient documentation

## 2012-05-11 DIAGNOSIS — C801 Malignant (primary) neoplasm, unspecified: Secondary | ICD-10-CM | POA: Insufficient documentation

## 2012-05-11 DIAGNOSIS — M1990 Unspecified osteoarthritis, unspecified site: Secondary | ICD-10-CM | POA: Insufficient documentation

## 2012-05-11 DIAGNOSIS — E119 Type 2 diabetes mellitus without complications: Secondary | ICD-10-CM | POA: Insufficient documentation

## 2012-05-21 ENCOUNTER — Emergency Department: Payer: Self-pay | Admitting: Emergency Medicine

## 2012-05-21 LAB — COMPREHENSIVE METABOLIC PANEL
Albumin: 4.1 g/dL (ref 3.4–5.0)
Alkaline Phosphatase: 92 U/L (ref 50–136)
Anion Gap: 7 (ref 7–16)
BUN: 10 mg/dL (ref 7–18)
Bilirubin,Total: 0.7 mg/dL (ref 0.2–1.0)
Chloride: 107 mmol/L (ref 98–107)
Co2: 27 mmol/L (ref 21–32)
Creatinine: 0.66 mg/dL (ref 0.60–1.30)
EGFR (Non-African Amer.): >60
Osmolality: 279 (ref 275–301)
Potassium: 4.6 mmol/L (ref 3.5–5.1)
SGPT (ALT): 25 U/L (ref 12–78)
Total Protein: 8.3 g/dL — ABNORMAL HIGH (ref 6.4–8.2)

## 2012-05-21 LAB — CBC
MCV: 85 fL (ref 80–100)
Platelet: 157 10*3/uL (ref 150–440)
RDW: 17.3 % — ABNORMAL HIGH (ref 11.5–14.5)

## 2012-06-10 ENCOUNTER — Emergency Department: Payer: Self-pay | Admitting: Internal Medicine

## 2012-06-10 LAB — BASIC METABOLIC PANEL
BUN: 20 mg/dL — ABNORMAL HIGH (ref 7–18)
Chloride: 109 mmol/L — ABNORMAL HIGH (ref 98–107)
Co2: 27 mmol/L (ref 21–32)
Creatinine: 0.76 mg/dL (ref 0.60–1.30)
EGFR (African American): >60
EGFR (Non-African Amer.): >60
Potassium: 3.6 mmol/L (ref 3.5–5.1)
Sodium: 144 mmol/L (ref 136–145)

## 2012-06-10 LAB — CBC
HCT: 37.7 % (ref 35.0–47.0)
HGB: 12.4 g/dL (ref 12.0–16.0)
MCH: 28.5 pg (ref 26.0–34.0)
MCHC: 32.9 g/dL (ref 32.0–36.0)
Platelet: 181 10*3/uL (ref 150–440)
RDW: 15.6 % — ABNORMAL HIGH (ref 11.5–14.5)

## 2012-06-10 LAB — PROTIME-INR
INR: 1
Prothrombin Time: 13.1 secs (ref 11.5–14.7)

## 2012-06-10 LAB — PRO B NATRIURETIC PEPTIDE: B-Type Natriuretic Peptide: 4867 pg/mL — ABNORMAL HIGH (ref 0–450)

## 2012-06-11 DIAGNOSIS — Z952 Presence of prosthetic heart valve: Secondary | ICD-10-CM | POA: Insufficient documentation

## 2012-06-11 DIAGNOSIS — I214 Non-ST elevation (NSTEMI) myocardial infarction: Secondary | ICD-10-CM | POA: Insufficient documentation

## 2012-06-11 DIAGNOSIS — I10 Essential (primary) hypertension: Secondary | ICD-10-CM | POA: Insufficient documentation

## 2012-06-11 DIAGNOSIS — Z9889 Other specified postprocedural states: Secondary | ICD-10-CM | POA: Insufficient documentation

## 2012-06-22 ENCOUNTER — Encounter: Payer: Self-pay | Admitting: *Deleted

## 2012-06-26 ENCOUNTER — Ambulatory Visit: Payer: Self-pay | Admitting: General Surgery

## 2012-07-02 ENCOUNTER — Encounter: Payer: Self-pay | Admitting: General Surgery

## 2012-07-03 ENCOUNTER — Ambulatory Visit: Payer: Self-pay | Admitting: Oncology

## 2012-07-04 ENCOUNTER — Encounter: Payer: Self-pay | Admitting: *Deleted

## 2012-07-23 ENCOUNTER — Encounter: Payer: Self-pay | Admitting: *Deleted

## 2012-07-26 ENCOUNTER — Encounter: Payer: Self-pay | Admitting: General Surgery

## 2012-07-26 ENCOUNTER — Ambulatory Visit (INDEPENDENT_AMBULATORY_CARE_PROVIDER_SITE_OTHER): Payer: Medicare Other | Admitting: General Surgery

## 2012-07-26 ENCOUNTER — Other Ambulatory Visit: Payer: Self-pay | Admitting: *Deleted

## 2012-07-26 VITALS — BP 130/64 | HR 66 | Resp 16 | Ht 62.0 in | Wt 143.0 lb

## 2012-07-26 DIAGNOSIS — Z853 Personal history of malignant neoplasm of breast: Secondary | ICD-10-CM

## 2012-07-26 NOTE — Progress Notes (Signed)
The patient has been asked to return to the office in one year for a unilateral left breast diagnostic mammogram. 

## 2012-07-26 NOTE — Progress Notes (Signed)
Patient ID: Mary Lambert, female   DOB: 04/28/1925, 79 y.o.   MRN: [id number]  Chief Complaint  Patient presents with  . Follow-up    mammogram    HPI Mary Lambert is a 79 y.o. female here today for her follow up mammogram. Patient had right breast mastectomy with SN biopsy in 2011. Patient states no new breast problems.Had a heart attack the 11th of March 2014. She also had a heart attack last year in which she missed her mammogram and office visit. Pt is doing better now. She is on Femara.  HPI  Past Medical History  Diagnosis Date  . Arthritis 15 years ago  . Asthma age 7  . Cancer 2011    right breast mastectomy with SN biopsy,CA-T1,N1 -  . Diabetes mellitus without complication age 45  . Hypertension   . Heart attack 2006,2013  . Cystitis age 62  . Anginal pain 2009  . Cardiac disease 2006  . Heart attack 2014    Past Surgical History  Procedure Laterality Date  . Breast surgery  June 2011    Right Breast Mastectomy with SN biopsy   . Colonoscopy  2001    Dr. Mechele Collin  . Aortic valve surgery  2006  . Coronary angioplasty with stent placement  2006  . Abdominal hysterectomy  age 29    History reviewed. No pertinent family history.  Social History History  Substance Use Topics  . Smoking status: Former Smoker -- 1.00 packs/day for 25 years  . Smokeless tobacco: Never Used  . Alcohol Use: No    Allergies  Allergen Reactions  . Sulfa Antibiotics Swelling  . Penicillins Rash  . Septra Ds (Sulfamethoxazole W-Trimethoprim) Swelling and Rash    Current Outpatient Prescriptions  Medication Sig Dispense Refill  . alendronate (FOSAMAX) 70 MG tablet Take 70 mg by mouth every 7 (seven) days. Take with a full glass of water on an empty stomach.      Marland Kitchen amLODipine (NORVASC) 10 MG tablet Take 10 mg by mouth daily.      Marland Kitchen aspirin 81 MG tablet Take 81 mg by mouth daily.      Marland Kitchen atorvastatin (LIPITOR) 80 MG tablet Take 80 mg by mouth daily.       . cetirizine (ZYRTEC)  10 MG tablet Take 10 mg by mouth daily.      . Fluticasone-Salmeterol (ADVAIR) 500-50 MCG/DOSE AEPB Inhale 1 puff into the lungs every 12 (twelve) hours.      . furosemide (LASIX) 40 MG tablet Take 40 mg by mouth daily.      . insulin glargine (LANTUS) 100 UNIT/ML injection Inject into the skin at bedtime.      . insulin regular (NOVOLIN R,HUMULIN R) 100 units/mL injection Inject into the skin 3 (three) times daily before meals.      Marland Kitchen letrozole (FEMARA) 2.5 MG tablet Take 2.5 mg by mouth daily.       Marland Kitchen levothyroxine (SYNTHROID, LEVOTHROID) 112 MCG tablet Take 112 mcg by mouth daily.      Marland Kitchen lisinopril (PRINIVIL,ZESTRIL) 5 MG tablet Take 5 mg by mouth daily.      . metoprolol (LOPRESSOR) 50 MG tablet Take 50 mg by mouth 2 (two) times daily.      . montelukast (SINGULAIR) 10 MG tablet Take 10 mg by mouth at bedtime.      . nitroGLYCERIN (NITROSTAT) 0.4 MG SL tablet Place 0.4 mg under the tongue as needed.      Marland Kitchen oxybutynin (DITROPAN) 5  MG tablet Take 5 mg by mouth 2 (two) times daily.       . tamsulosin (FLOMAX) 0.4 MG CAPS 0.4 mg daily after supper.       . temazepam (RESTORIL) 15 MG capsule Take 15 mg by mouth at bedtime as needed.      . tiotropium (SPIRIVA HANDIHALER) 18 MCG inhalation capsule Place 18 mcg into inhaler and inhale daily.       No current facility-administered medications for this visit.    Review of Systems Review of Systems  Constitutional: Negative.   Respiratory: Negative.   Cardiovascular: Negative.     Blood pressure 130/64, pulse 66, resp. rate 16, height 5\' 2"  (1.575 m), weight 143 lb (64.864 kg).  Physical Exam Physical Exam  Constitutional: She is oriented to person, place, and time. She appears well-developed and well-nourished.  Eyes: Conjunctivae are normal. No scleral icterus.  Neck: Trachea normal. No mass and no thyromegaly present.  Cardiovascular: Normal rate and regular rhythm.   Murmur heard.  Systolic murmur is present with a grade of 6/6   Pulses:      Dorsalis pedis pulses are 0 on the right side, and 0 on the left side.       Posterior tibial pulses are 0 on the right side, and 0 on the left side.  Mild ankle edema. Feet are warm with brisk capillary refill even though she had no palpable pulses.   Pulmonary/Chest: Effort normal and breath sounds normal. Left breast exhibits no inverted nipple, no mass, no nipple discharge, no skin change and no tenderness.  Right mastectomy site is well healed with no evidence of local reoccurrence.  Lymphadenopathy:    She has no cervical adenopathy.    She has no axillary adenopathy.  Neurological: She is alert and oriented to person, place, and time.  Skin: Skin is warm and dry.    Data Reviewed Left mammogram reviewed-stable  Assessment    History of right breast Ca. Recent cardiac events. Doing well at present     Plan    1 yr f/u with left mammogram.         Gerlene Burdock G 07/27/2012, 6:31 AM

## 2012-07-26 NOTE — Patient Instructions (Addendum)
Patient to return in 1 year with left diagnostic mammogram.

## 2012-07-27 ENCOUNTER — Encounter: Payer: Self-pay | Admitting: General Surgery

## 2012-08-01 ENCOUNTER — Telehealth: Payer: Self-pay | Admitting: *Deleted

## 2012-08-01 NOTE — Telephone Encounter (Signed)
Patient called to ask if there was anything that could be done about a letter she received from Dr. Doylene Canning office stating that she has been dismissed from his care due to missed appointments. Patient missed appointments due to heart attack and was hospitalized. Per Elon Jester the best thing to do would be to contact Dr. Aleda Grana nurse and explain the situation. Patient verbalized she is aware and will do so. She will let us know if anything else happens. Thank you.

## 2012-09-04 ENCOUNTER — Inpatient Hospital Stay: Payer: Self-pay | Admitting: Student

## 2012-09-04 LAB — URINALYSIS, COMPLETE
Ketone: NEGATIVE
Nitrite: NEGATIVE
Ph: 6 (ref 4.5–8.0)
Protein: NEGATIVE
Specific Gravity: 1.008 (ref 1.003–1.030)
Squamous Epithelial: <1
WBC UR: 71 /HPF (ref 0–5)

## 2012-09-04 LAB — COMPREHENSIVE METABOLIC PANEL
Alkaline Phosphatase: 88 U/L (ref 50–136)
Anion Gap: 4 — ABNORMAL LOW (ref 7–16)
Calcium, Total: 9.2 mg/dL (ref 8.5–10.1)
Chloride: 105 mmol/L (ref 98–107)
Creatinine: 0.84 mg/dL (ref 0.60–1.30)
EGFR (Non-African Amer.): >60
Glucose: 42 mg/dL — ABNORMAL LOW (ref 65–99)
SGOT(AST): 23 U/L (ref 15–37)
SGPT (ALT): 21 U/L (ref 12–78)
Sodium: 138 mmol/L (ref 136–145)

## 2012-09-04 LAB — CBC
HCT: 35.3 % (ref 35.0–47.0)
HGB: 12 g/dL (ref 12.0–16.0)
MCHC: 33.9 g/dL (ref 32.0–36.0)
MCV: 84 fL (ref 80–100)
Platelet: 172 10*3/uL (ref 150–440)
RDW: 15.6 % — ABNORMAL HIGH (ref 11.5–14.5)
WBC: 5 10*3/uL (ref 3.6–11.0)

## 2012-09-04 LAB — CK TOTAL AND CKMB (NOT AT ARMC)
CK, Total: 55 U/L (ref 21–215)
CK-MB: 1.2 ng/mL (ref 0.5–3.6)

## 2012-09-05 LAB — HEMOGLOBIN A1C: Hemoglobin A1C: 7.5 % — ABNORMAL HIGH (ref 4.2–6.3)

## 2012-09-07 LAB — CBC WITH DIFFERENTIAL/PLATELET
Basophil %: 1.2 %
HCT: 38.1 % (ref 35.0–47.0)
HGB: 12.9 g/dL (ref 12.0–16.0)
Lymphocyte #: 0.9 10*3/uL — ABNORMAL LOW (ref 1.0–3.6)
MCH: 28.5 pg (ref 26.0–34.0)
MCHC: 33.8 g/dL (ref 32.0–36.0)
MCV: 84 fL (ref 80–100)
Monocyte %: 11.9 %
Neutrophil #: 3.3 10*3/uL (ref 1.4–6.5)
Neutrophil %: 67.1 %
RDW: 15.6 % — ABNORMAL HIGH (ref 11.5–14.5)
WBC: 5 10*3/uL (ref 3.6–11.0)

## 2012-09-07 LAB — BASIC METABOLIC PANEL
Anion Gap: 7 (ref 7–16)
BUN: 30 mg/dL — ABNORMAL HIGH (ref 7–18)
Calcium, Total: 9 mg/dL (ref 8.5–10.1)
Creatinine: 0.94 mg/dL (ref 0.60–1.30)
Glucose: 197 mg/dL — ABNORMAL HIGH (ref 65–99)
Osmolality: 282 (ref 275–301)
Potassium: 3.2 mmol/L — ABNORMAL LOW (ref 3.5–5.1)

## 2012-09-08 LAB — BASIC METABOLIC PANEL
Chloride: 99 mmol/L (ref 98–107)
Co2: 29 mmol/L (ref 21–32)
EGFR (African American): >60
EGFR (Non-African Amer.): 55 — ABNORMAL LOW
Osmolality: 290 (ref 275–301)
Potassium: 4.1 mmol/L (ref 3.5–5.1)

## 2012-09-08 LAB — GENTAMICIN LEVEL, TROUGH: Gentamicin, Trough: <0.2 ug/mL — ABNORMAL LOW (ref 0.0–2.0)

## 2012-09-08 LAB — GENTAMICIN LEVEL, PEAK: Gentamicin, Peak: 2.2 ug/mL — ABNORMAL LOW (ref 4.0–8.0)

## 2012-09-08 LAB — URINE CULTURE

## 2012-09-08 LAB — SEDIMENTATION RATE: Erythrocyte Sed Rate: 6 mm/hr (ref 0–30)

## 2012-09-09 LAB — BASIC METABOLIC PANEL
Anion Gap: 4 — ABNORMAL LOW (ref 7–16)
Calcium, Total: 8.9 mg/dL (ref 8.5–10.1)
Chloride: 103 mmol/L (ref 98–107)
Co2: 30 mmol/L (ref 21–32)
EGFR (Non-African Amer.): 57 — ABNORMAL LOW
Glucose: 229 mg/dL — ABNORMAL HIGH (ref 65–99)
Osmolality: 285 (ref 275–301)
Sodium: 137 mmol/L (ref 136–145)

## 2012-09-09 LAB — GENTAMICIN LEVEL, PEAK: Gentamicin, Peak: 3.1 ug/mL — ABNORMAL LOW (ref 4.0–8.0)

## 2012-09-10 LAB — BASIC METABOLIC PANEL
BUN: 19 mg/dL — ABNORMAL HIGH (ref 7–18)
Calcium, Total: 9.1 mg/dL (ref 8.5–10.1)
Chloride: 100 mmol/L (ref 98–107)
Creatinine: 0.7 mg/dL (ref 0.60–1.30)
EGFR (African American): >60
Glucose: 203 mg/dL — ABNORMAL HIGH (ref 65–99)

## 2012-09-11 LAB — CULTURE, BLOOD (SINGLE)

## 2012-09-12 ENCOUNTER — Encounter: Payer: Self-pay | Admitting: Internal Medicine

## 2012-09-12 LAB — CULTURE, BLOOD (SINGLE)

## 2012-09-13 LAB — BASIC METABOLIC PANEL
Anion Gap: 7 (ref 7–16)
BUN: 22 mg/dL — ABNORMAL HIGH (ref 7–18)
Calcium, Total: 9.1 mg/dL (ref 8.5–10.1)
Chloride: 101 mmol/L (ref 98–107)
Co2: 28 mmol/L (ref 21–32)
Creatinine: 0.88 mg/dL (ref 0.60–1.30)
EGFR (African American): >60
Osmolality: 282 (ref 275–301)
Sodium: 136 mmol/L (ref 136–145)

## 2012-09-13 LAB — VANCOMYCIN, TROUGH: Vancomycin, Trough: 13 ug/mL (ref 10–20)

## 2012-09-15 LAB — VANCOMYCIN, TROUGH: Vancomycin, Trough: 21 ug/mL (ref 10–20)

## 2012-09-18 LAB — VANCOMYCIN, TROUGH: Vancomycin, Trough: 15 ug/mL (ref 10–20)

## 2012-09-20 DIAGNOSIS — I38 Endocarditis, valve unspecified: Secondary | ICD-10-CM | POA: Insufficient documentation

## 2012-09-20 LAB — BASIC METABOLIC PANEL
Anion Gap: 5 — ABNORMAL LOW (ref 7–16)
Calcium, Total: 8.5 mg/dL (ref 8.5–10.1)
Creatinine: 0.83 mg/dL (ref 0.60–1.30)
EGFR (African American): >60
EGFR (Non-African Amer.): >60
Osmolality: 284 (ref 275–301)
Sodium: 140 mmol/L (ref 136–145)

## 2012-09-23 LAB — GENTAMICIN LEVEL, TROUGH: Gentamicin, Trough: 0.3 ug/mL (ref 0.0–2.0)

## 2012-09-23 LAB — GENTAMICIN LEVEL, PEAK: Gentamicin, Peak: 1.3 ug/mL — ABNORMAL LOW (ref 4.0–8.0)

## 2012-09-25 LAB — VANCOMYCIN, TROUGH: Vancomycin, Trough: 18 ug/mL (ref 10–20)

## 2012-09-27 LAB — CBC WITH DIFFERENTIAL/PLATELET
Basophil #: 0 10*3/uL (ref 0.0–0.1)
Basophil %: 1.1 %
Eosinophil #: 0.3 10*3/uL (ref 0.0–0.7)
Eosinophil %: 6.8 %
HCT: 31.7 % — ABNORMAL LOW (ref 35.0–47.0)
HGB: 10.8 g/dL — ABNORMAL LOW (ref 12.0–16.0)
Lymphocyte #: 0.6 10*3/uL — ABNORMAL LOW (ref 1.0–3.6)
Lymphocyte %: 13.5 %
MCH: 28.7 pg (ref 26.0–34.0)
MCHC: 34 g/dL (ref 32.0–36.0)
MCV: 84 fL (ref 80–100)
Monocyte #: 0.4 x10 3/mm (ref 0.2–0.9)
Monocyte %: 9.9 %
Neutrophil #: 3.1 10*3/uL (ref 1.4–6.5)
Neutrophil %: 68.7 %
Platelet: 159 10*3/uL (ref 150–440)
RBC: 3.76 10*6/uL — ABNORMAL LOW (ref 3.80–5.20)
RDW: 16 % — ABNORMAL HIGH (ref 11.5–14.5)

## 2012-09-27 LAB — GENTAMICIN LEVEL, PEAK: Gentamicin, Peak: 0.3 ug/mL — ABNORMAL LOW (ref 4.0–8.0)

## 2012-09-27 LAB — GENTAMICIN LEVEL, TROUGH: Gentamicin, Trough: 5 ug/mL (ref 0.0–2.0)

## 2012-10-02 ENCOUNTER — Encounter: Payer: Self-pay | Admitting: Internal Medicine

## 2012-10-02 ENCOUNTER — Ambulatory Visit: Payer: Self-pay | Admitting: Oncology

## 2012-10-02 LAB — CREATININE, SERUM: Creatinine: 1.14 mg/dL (ref 0.60–1.30)

## 2012-10-02 LAB — GENTAMICIN LEVEL, PEAK: Gentamicin, Peak: 4.9 ug/mL (ref 4.0–8.0)

## 2012-10-04 LAB — CBC WITH DIFFERENTIAL/PLATELET
Basophil #: 0 10*3/uL (ref 0.0–0.1)
Eosinophil #: 0.3 10*3/uL (ref 0.0–0.7)
HCT: 31.5 % — ABNORMAL LOW (ref 35.0–47.0)
HGB: 10.9 g/dL — ABNORMAL LOW (ref 12.0–16.0)
Lymphocyte %: 14.2 %
MCH: 29 pg (ref 26.0–34.0)
MCV: 84 fL (ref 80–100)
Neutrophil #: 2.6 10*3/uL (ref 1.4–6.5)
RBC: 3.76 10*6/uL — ABNORMAL LOW (ref 3.80–5.20)
RDW: 15.5 % — ABNORMAL HIGH (ref 11.5–14.5)
WBC: 4 10*3/uL (ref 3.6–11.0)

## 2012-10-04 LAB — BASIC METABOLIC PANEL
Calcium, Total: 8.7 mg/dL (ref 8.5–10.1)
Creatinine: 1.2 mg/dL (ref 0.60–1.30)
EGFR (African American): 48 — ABNORMAL LOW
Glucose: 290 mg/dL — ABNORMAL HIGH (ref 65–99)
Osmolality: 281 (ref 275–301)
Potassium: 3.3 mmol/L — ABNORMAL LOW (ref 3.5–5.1)
Sodium: 134 mmol/L — ABNORMAL LOW (ref 136–145)

## 2012-10-04 LAB — SEDIMENTATION RATE: Erythrocyte Sed Rate: 17 mm/hr (ref 0–30)

## 2012-10-17 ENCOUNTER — Ambulatory Visit: Payer: Self-pay | Admitting: Oncology

## 2012-11-02 ENCOUNTER — Ambulatory Visit: Payer: Self-pay | Admitting: Oncology

## 2012-12-20 ENCOUNTER — Emergency Department: Payer: Self-pay | Admitting: Emergency Medicine

## 2012-12-21 LAB — COMPREHENSIVE METABOLIC PANEL
Anion Gap: 6 — ABNORMAL LOW (ref 7–16)
BUN: 18 mg/dL (ref 7–18)
Co2: 31 mmol/L (ref 21–32)
Creatinine: 1.14 mg/dL (ref 0.60–1.30)
EGFR (Non-African Amer.): 44 — ABNORMAL LOW
Osmolality: 289 (ref 275–301)
Potassium: 3.4 mmol/L — ABNORMAL LOW (ref 3.5–5.1)
SGOT(AST): 14 U/L — ABNORMAL LOW (ref 15–37)
SGPT (ALT): 13 U/L (ref 12–78)
Total Protein: 7 g/dL (ref 6.4–8.2)

## 2012-12-21 LAB — CBC
MCHC: 34.5 g/dL (ref 32.0–36.0)
MCV: 84 fL (ref 80–100)
Platelet: 168 10*3/uL (ref 150–440)
RBC: 3.75 10*6/uL — ABNORMAL LOW (ref 3.80–5.20)

## 2012-12-21 LAB — URINALYSIS, COMPLETE
Bacteria: NONE SEEN
Glucose,UR: >=500 mg/dL (ref 0–75)
Ketone: NEGATIVE
Nitrite: NEGATIVE
Ph: 6 (ref 4.5–8.0)
Protein: NEGATIVE
Specific Gravity: 1.013 (ref 1.003–1.030)
Squamous Epithelial: NONE SEEN
WBC UR: 3 /HPF (ref 0–5)

## 2013-04-25 ENCOUNTER — Ambulatory Visit: Payer: Self-pay | Admitting: Oncology

## 2013-04-25 LAB — COMPREHENSIVE METABOLIC PANEL
ALK PHOS: 109 U/L
ALT: 18 U/L (ref 12–78)
ANION GAP: 7 (ref 7–16)
Albumin: 4.3 g/dL (ref 3.4–5.0)
BUN: 21 mg/dL — AB (ref 7–18)
Bilirubin,Total: 0.6 mg/dL (ref 0.2–1.0)
CALCIUM: 8.8 mg/dL (ref 8.5–10.1)
Chloride: 97 mmol/L — ABNORMAL LOW (ref 98–107)
Co2: 33 mmol/L — ABNORMAL HIGH (ref 21–32)
Creatinine: 1.05 mg/dL (ref 0.60–1.30)
EGFR (Non-African Amer.): 48 — ABNORMAL LOW
GFR CALC AF AMER: 56 — AB
GLUCOSE: 361 mg/dL — AB (ref 65–99)
OSMOLALITY: 291 (ref 275–301)
Potassium: 3.5 mmol/L (ref 3.5–5.1)
SGOT(AST): 16 U/L (ref 15–37)
Sodium: 137 mmol/L (ref 136–145)
Total Protein: 8 g/dL (ref 6.4–8.2)

## 2013-04-25 LAB — CBC CANCER CENTER
BASOS ABS: 0.1 x10 3/mm (ref 0.0–0.1)
BASOS PCT: 1.6 %
Eosinophil #: 0.1 x10 3/mm (ref 0.0–0.7)
Eosinophil %: 2.7 %
HCT: 36 % (ref 35.0–47.0)
HGB: 11.9 g/dL — ABNORMAL LOW (ref 12.0–16.0)
Lymphocyte #: 0.7 x10 3/mm — ABNORMAL LOW (ref 1.0–3.6)
Lymphocyte %: 19.3 %
MCH: 28.1 pg (ref 26.0–34.0)
MCHC: 33.1 g/dL (ref 32.0–36.0)
MCV: 85 fL (ref 80–100)
Monocyte #: 0.4 x10 3/mm (ref 0.2–0.9)
Monocyte %: 11 %
NEUTROS ABS: 2.5 x10 3/mm (ref 1.4–6.5)
Neutrophil %: 65.4 %
PLATELETS: 157 x10 3/mm (ref 150–440)
RBC: 4.24 10*6/uL (ref 3.80–5.20)
RDW: 15 % — ABNORMAL HIGH (ref 11.5–14.5)
WBC: 3.8 x10 3/mm (ref 3.6–11.0)

## 2013-04-26 LAB — CANCER ANTIGEN 27.29: CA 27.29: 20.4 U/mL (ref 0.0–38.6)

## 2013-05-05 ENCOUNTER — Ambulatory Visit: Payer: Self-pay | Admitting: Oncology

## 2013-07-30 ENCOUNTER — Encounter: Payer: Self-pay | Admitting: General Surgery

## 2013-07-30 ENCOUNTER — Ambulatory Visit (INDEPENDENT_AMBULATORY_CARE_PROVIDER_SITE_OTHER): Payer: Medicare Other | Admitting: General Surgery

## 2013-07-30 VITALS — BP 140/80 | HR 80 | Resp 14 | Ht 62.0 in | Wt 136.0 lb

## 2013-07-30 DIAGNOSIS — C50319 Malignant neoplasm of lower-inner quadrant of unspecified female breast: Secondary | ICD-10-CM | POA: Insufficient documentation

## 2013-07-30 NOTE — Progress Notes (Signed)
Patient ID: Mary Lambert, female   DOB: June 21, 1925, 80 y.o.   MRN: [id number]  Chief Complaint  Patient presents with  . Follow-up    mammogram    HPI Mary Lambert is a 80 y.o. female.  here today for her follow up mammogram. Patient had right breast mastectomy with SN biopsy in 2011. Patient states no new breast problems. She is on Femara and tolerating it.  HPI  Past Medical History  Diagnosis Date  . Arthritis 15 years ago  . Asthma age 70  . Cancer 2011    right breast mastectomy with SN biopsy,CA-T1,N1 -  . Diabetes mellitus without complication age 37  . Hypertension   . Heart attack 2006,2013  . Cystitis age 34  . Anginal pain 2009  . Cardiac disease 2006  . Heart attack 2014    Past Surgical History  Procedure Laterality Date  . Breast surgery  June 2011    Right Breast Mastectomy with SN biopsy   . Colonoscopy  2001    Dr. Vira Agar  . Aortic valve surgery  2006  . Coronary angioplasty with stent placement  2006  . Abdominal hysterectomy  age 39    History reviewed. No pertinent family history.  Social History History  Substance Use Topics  . Smoking status: Former Smoker -- 1.00 packs/day for 25 years  . Smokeless tobacco: Never Used  . Alcohol Use: No    Allergies  Allergen Reactions  . Sulfa Antibiotics Swelling  . Penicillins Rash  . Septra Ds [Sulfamethoxazole-Trimethoprim] Swelling and Rash    Current Outpatient Prescriptions  Medication Sig Dispense Refill  . acetaminophen (TYLENOL) 500 MG tablet Take 500 mg by mouth daily.      Marland Kitchen albuterol (PROVENTIL HFA;VENTOLIN HFA) 108 (90 BASE) MCG/ACT inhaler Inhale into the lungs every 6 (six) hours as needed for wheezing or shortness of breath.      Marland Kitchen alendronate (FOSAMAX) 70 MG tablet Take 70 mg by mouth every 7 (seven) days. Take with a full glass of water on an empty stomach.      Marland Kitchen amLODipine (NORVASC) 10 MG tablet Take 10 mg by mouth daily.      Marland Kitchen aspirin 81 MG tablet Take 81 mg by mouth daily.       Marland Kitchen atorvastatin (LIPITOR) 80 MG tablet Take 80 mg by mouth daily.       . B-D INS SYRINGE 0.5CC/31GX5/16 31G X 5/16" 0.5 ML MISC       . calcium-vitamin D (OSCAL WITH D) 500-200 MG-UNIT per tablet Take 1 tablet by mouth daily with breakfast.      . cetirizine (ZYRTEC) 10 MG tablet Take 10 mg by mouth as needed.       . clopidogrel (PLAVIX) 75 MG tablet       . docusate sodium (COLACE) 100 MG capsule Take 100 mg by mouth daily as needed for mild constipation.      . furosemide (LASIX) 40 MG tablet Take 40 mg by mouth daily.      . insulin glargine (LANTUS) 100 UNIT/ML injection Inject into the skin at bedtime.      . insulin regular (NOVOLIN R,HUMULIN R) 100 units/mL injection Inject into the skin 3 (three) times daily before meals.      Marland Kitchen letrozole (FEMARA) 2.5 MG tablet Take 2.5 mg by mouth daily.       Marland Kitchen levothyroxine (SYNTHROID, LEVOTHROID) 112 MCG tablet Take 112 mcg by mouth daily.      Marland Kitchen lisinopril (  PRINIVIL,ZESTRIL) 5 MG tablet Take 5 mg by mouth daily.      . metoprolol succinate (TOPROL-XL) 50 MG 24 hr tablet       . montelukast (SINGULAIR) 10 MG tablet Take 10 mg by mouth at bedtime.      Marland Kitchen NEXIUM 40 MG capsule       . nitroGLYCERIN (NITROSTAT) 0.4 MG SL tablet Place 0.4 mg under the tongue as needed.      Marland Kitchen NOVOLOG 100 UNIT/ML injection       . oxybutynin (DITROPAN) 5 MG tablet Take 5 mg by mouth 2 (two) times daily.       Marland Kitchen senna (SENOKOT) 8.6 MG tablet Take 1 tablet by mouth daily.      . simvastatin (ZOCOR) 20 MG tablet       . tamsulosin (FLOMAX) 0.4 MG CAPS 0.4 mg daily after supper.       . temazepam (RESTORIL) 15 MG capsule Take 15 mg by mouth at bedtime as needed.      . tiotropium (SPIRIVA HANDIHALER) 18 MCG inhalation capsule Place 18 mcg into inhaler and inhale daily.      Marland Kitchen triamcinolone cream (KENALOG) 0.1 % Apply 1 application topically as needed.       No current facility-administered medications for this visit.    Review of Systems Review of Systems   Constitutional: Negative.   Respiratory: Positive for cough and shortness of breath.   Cardiovascular: Negative.     Blood pressure 140/80, pulse 80, resp. rate 14, height 5\' 2"  (1.575 m), weight 136 lb (61.689 kg).  Physical Exam Physical Exam  Constitutional: She is oriented to person, place, and time. She appears well-developed and well-nourished.  Eyes: Conjunctivae are normal.  Neck: Neck supple.  Cardiovascular: Normal rate and regular rhythm.   Murmur heard.  Systolic (pan systolic unchanged from before.) murmur is present  Pulmonary/Chest: Effort normal and breath sounds normal. Left breast exhibits no inverted nipple, no mass, no nipple discharge, no skin change and no tenderness.  Right mastectomy site well healed no local reoccurrences.  Abdominal: Soft. There is no tenderness.  Lymphadenopathy:    She has no cervical adenopathy.    She has no axillary adenopathy.  Neurological: She is alert and oriented to person, place, and time.  Skin: Skin is warm and dry.    Data Reviewed Left mammogram reviewed and stable.  Assessment    Stable physical exam. CA right breast, T1,N1, Er/PR pos    Plan    Follow up in one year with left diagnostic mammogram and office visit.       Seeplaputhur G Sankar 07/30/2013, 10:15 AM

## 2013-07-30 NOTE — Patient Instructions (Addendum)
Continue self breast exams. Call office for any new breast issues or concerns. Follow up in one year with left diagnostic mammogram and office visit

## 2013-09-24 ENCOUNTER — Emergency Department: Payer: Self-pay | Admitting: Emergency Medicine

## 2013-09-24 LAB — URINALYSIS, COMPLETE
BACTERIA: NONE SEEN
Bilirubin,UR: NEGATIVE
Blood: NEGATIVE
Glucose,UR: NEGATIVE mg/dL (ref 0–75)
KETONE: NEGATIVE
NITRITE: NEGATIVE
Ph: 7 (ref 4.5–8.0)
Protein: NEGATIVE
SPECIFIC GRAVITY: 1.011 (ref 1.003–1.030)
SQUAMOUS EPITHELIAL: NONE SEEN

## 2013-10-10 ENCOUNTER — Emergency Department: Payer: Self-pay | Admitting: Emergency Medicine

## 2013-10-10 LAB — BASIC METABOLIC PANEL
Anion Gap: 7 (ref 7–16)
BUN: 25 mg/dL — AB (ref 7–18)
CO2: 29 mmol/L (ref 21–32)
Calcium, Total: 9.2 mg/dL (ref 8.5–10.1)
Chloride: 100 mmol/L (ref 98–107)
Creatinine: 1 mg/dL (ref 0.60–1.30)
EGFR (African American): 59 — ABNORMAL LOW
GFR CALC NON AF AMER: 51 — AB
Glucose: 441 mg/dL — ABNORMAL HIGH (ref 65–99)
Osmolality: 295 (ref 275–301)
POTASSIUM: 4.8 mmol/L (ref 3.5–5.1)
Sodium: 136 mmol/L (ref 136–145)

## 2013-10-10 LAB — CBC
HCT: 35.2 % (ref 35.0–47.0)
HGB: 11.4 g/dL — ABNORMAL LOW (ref 12.0–16.0)
MCH: 27.3 pg (ref 26.0–34.0)
MCHC: 32.4 g/dL (ref 32.0–36.0)
MCV: 84 fL (ref 80–100)
Platelet: 154 10*3/uL (ref 150–440)
RBC: 4.19 10*6/uL (ref 3.80–5.20)
RDW: 16.3 % — ABNORMAL HIGH (ref 11.5–14.5)
WBC: 3.5 10*3/uL — AB (ref 3.6–11.0)

## 2013-10-10 LAB — URINALYSIS, COMPLETE
BACTERIA: NONE SEEN
BILIRUBIN, UR: NEGATIVE
Leukocyte Esterase: NEGATIVE
NITRITE: POSITIVE
PROTEIN: NEGATIVE
Ph: 6 (ref 4.5–8.0)
Specific Gravity: 1.027 (ref 1.003–1.030)

## 2013-10-24 ENCOUNTER — Ambulatory Visit: Payer: Self-pay | Admitting: Oncology

## 2013-10-24 LAB — COMPREHENSIVE METABOLIC PANEL
ALBUMIN: 3.8 g/dL (ref 3.4–5.0)
ANION GAP: 8 (ref 7–16)
Alkaline Phosphatase: 93 U/L
BILIRUBIN TOTAL: 0.5 mg/dL (ref 0.2–1.0)
BUN: 23 mg/dL — ABNORMAL HIGH (ref 7–18)
CALCIUM: 9.2 mg/dL (ref 8.5–10.1)
Chloride: 102 mmol/L (ref 98–107)
Co2: 27 mmol/L (ref 21–32)
Creatinine: 0.88 mg/dL (ref 0.60–1.30)
EGFR (African American): >60
EGFR (Non-African Amer.): 59 — ABNORMAL LOW
GLUCOSE: 271 mg/dL — AB (ref 65–99)
Osmolality: 287 (ref 275–301)
Potassium: 4.7 mmol/L (ref 3.5–5.1)
SGOT(AST): 27 U/L (ref 15–37)
SGPT (ALT): 33 U/L
Sodium: 137 mmol/L (ref 136–145)
Total Protein: 7.2 g/dL (ref 6.4–8.2)

## 2013-10-24 LAB — CBC CANCER CENTER
BASOS ABS: 0.1 x10 3/mm (ref 0.0–0.1)
BASOS PCT: 1.1 %
Eosinophil #: 0.1 x10 3/mm (ref 0.0–0.7)
Eosinophil %: 2.9 %
HCT: 35.4 % (ref 35.0–47.0)
HGB: 11.2 g/dL — ABNORMAL LOW (ref 12.0–16.0)
LYMPHS PCT: 11.8 %
Lymphocyte #: 0.5 x10 3/mm — ABNORMAL LOW (ref 1.0–3.6)
MCH: 27 pg (ref 26.0–34.0)
MCHC: 31.7 g/dL — ABNORMAL LOW (ref 32.0–36.0)
MCV: 85 fL (ref 80–100)
Monocyte #: 0.3 x10 3/mm (ref 0.2–0.9)
Monocyte %: 7.9 %
Neutrophil #: 3.4 x10 3/mm (ref 1.4–6.5)
Neutrophil %: 76.3 %
PLATELETS: 138 x10 3/mm — AB (ref 150–440)
RBC: 4.16 10*6/uL (ref 3.80–5.20)
RDW: 15.9 % — ABNORMAL HIGH (ref 11.5–14.5)
WBC: 4.4 x10 3/mm (ref 3.6–11.0)

## 2013-10-25 DIAGNOSIS — I2510 Atherosclerotic heart disease of native coronary artery without angina pectoris: Secondary | ICD-10-CM | POA: Insufficient documentation

## 2013-10-25 DIAGNOSIS — E039 Hypothyroidism, unspecified: Secondary | ICD-10-CM | POA: Insufficient documentation

## 2013-10-25 DIAGNOSIS — J449 Chronic obstructive pulmonary disease, unspecified: Secondary | ICD-10-CM | POA: Insufficient documentation

## 2013-10-25 LAB — CANCER ANTIGEN 27.29: CA 27.29: 19 U/mL (ref 0.0–38.6)

## 2013-11-02 ENCOUNTER — Ambulatory Visit: Payer: Self-pay | Admitting: Oncology

## 2013-12-01 ENCOUNTER — Emergency Department: Payer: Self-pay | Admitting: Emergency Medicine

## 2013-12-01 LAB — CBC WITH DIFFERENTIAL/PLATELET
BASOS PCT: 0.9 %
Basophil #: 0.1 10*3/uL (ref 0.0–0.1)
EOS PCT: 1.4 %
Eosinophil #: 0.1 10*3/uL (ref 0.0–0.7)
HCT: 37.6 % (ref 35.0–47.0)
HGB: 12.2 g/dL (ref 12.0–16.0)
Lymphocyte #: 0.5 10*3/uL — ABNORMAL LOW (ref 1.0–3.6)
Lymphocyte %: 7.8 %
MCH: 27.2 pg (ref 26.0–34.0)
MCHC: 32.5 g/dL (ref 32.0–36.0)
MCV: 84 fL (ref 80–100)
MONOS PCT: 8.9 %
Monocyte #: 0.6 x10 3/mm (ref 0.2–0.9)
NEUTROS ABS: 5.5 10*3/uL (ref 1.4–6.5)
Neutrophil %: 81 %
Platelet: 157 10*3/uL (ref 150–440)
RBC: 4.49 10*6/uL (ref 3.80–5.20)
RDW: 15.8 % — ABNORMAL HIGH (ref 11.5–14.5)
WBC: 6.8 10*3/uL (ref 3.6–11.0)

## 2013-12-01 LAB — BASIC METABOLIC PANEL
Anion Gap: 8 (ref 7–16)
BUN: 26 mg/dL — ABNORMAL HIGH (ref 7–18)
CO2: 27 mmol/L (ref 21–32)
Calcium, Total: 8.5 mg/dL (ref 8.5–10.1)
Chloride: 104 mmol/L (ref 98–107)
Creatinine: 1.05 mg/dL (ref 0.60–1.30)
EGFR (African American): 56 — ABNORMAL LOW
EGFR (Non-African Amer.): 48 — ABNORMAL LOW
GLUCOSE: 148 mg/dL — AB (ref 65–99)
Osmolality: 285 (ref 275–301)
Potassium: 3.3 mmol/L — ABNORMAL LOW (ref 3.5–5.1)
Sodium: 139 mmol/L (ref 136–145)

## 2013-12-01 LAB — URINALYSIS, COMPLETE
BACTERIA: NONE SEEN
Bilirubin,UR: NEGATIVE
Blood: NEGATIVE
Glucose,UR: 50 mg/dL (ref 0–75)
Ketone: NEGATIVE
Leukocyte Esterase: NEGATIVE
Nitrite: NEGATIVE
PH: 5 (ref 4.5–8.0)
Protein: NEGATIVE
RBC,UR: 1 /HPF (ref 0–5)
Specific Gravity: 1.005 (ref 1.003–1.030)
Squamous Epithelial: <1
WBC UR: NONE SEEN /HPF (ref 0–5)

## 2013-12-01 LAB — TROPONIN I: TROPONIN-I: 0.15 ng/mL — AB

## 2013-12-02 LAB — TROPONIN I: Troponin-I: 0.16 ng/mL — ABNORMAL HIGH

## 2014-01-03 ENCOUNTER — Ambulatory Visit (INDEPENDENT_AMBULATORY_CARE_PROVIDER_SITE_OTHER): Payer: Medicare Other

## 2014-01-03 ENCOUNTER — Ambulatory Visit (INDEPENDENT_AMBULATORY_CARE_PROVIDER_SITE_OTHER): Payer: Medicare Other | Admitting: Podiatry

## 2014-01-03 ENCOUNTER — Encounter: Payer: Self-pay | Admitting: Podiatry

## 2014-01-03 VITALS — BP 150/85 | HR 82 | Resp 16 | Ht 63.0 in | Wt 143.0 lb

## 2014-01-03 DIAGNOSIS — M79673 Pain in unspecified foot: Secondary | ICD-10-CM

## 2014-01-03 DIAGNOSIS — S9030XA Contusion of unspecified foot, initial encounter: Secondary | ICD-10-CM | POA: Diagnosis not present

## 2014-01-03 DIAGNOSIS — E1151 Type 2 diabetes mellitus with diabetic peripheral angiopathy without gangrene: Secondary | ICD-10-CM | POA: Diagnosis not present

## 2014-01-03 DIAGNOSIS — B351 Tinea unguium: Secondary | ICD-10-CM | POA: Diagnosis not present

## 2014-01-03 NOTE — Progress Notes (Signed)
   Subjective:    Patient ID: Mary Lambert, female    DOB: 28-Jul-1925, 80 y.o.   MRN: [id number]  HPI Comments: Got up out of bed and walked into the wall and jammed both feet into the wall and the second toes on both feet are hurting, my feet and legs have been swelling and i am diabetic for 27 years, blood sugars have been running high it was 241 yesterday morning when i got up. i need to have my feet examined and my toenails cut   Diabetes      Review of Systems  Endocrine:       Diabetes   All other systems reviewed and are negative.      Objective:   Physical Exam        Assessment & Plan:

## 2014-01-03 NOTE — Progress Notes (Signed)
Subjective:     Patient ID: Mary Lambert, female   DOB: 1925-06-14, 80 y.o.   MRN: [id number]  Diabetes   patient G. him both feet and is concerned about fractures of her toes and swelling on her feet and has nail disease 1-5 of both feet that become painful and she cannot cut. Also is a long-term diabetic not in good control and that like diabetic shoes do to history of ulceration   Review of Systems  All other systems reviewed and are negative.      Objective:   Physical Exam  Nursing note and vitals reviewed. Cardiovascular: Intact distal pulses.   Musculoskeletal: Normal range of motion.  Neurological: She is alert.  Skin: Skin is warm and dry.   vascular status found to be diminished both DP and PT pulses with history of upon questioning of claudication symptoms in the lower legs. Patient's cut structural deformity of both feet with diminishment also neurological sensation is noted to have nail disease with thickness and incurvation 1-5 both feet and elevated toes noted     Assessment:     At risk diabetic with trauma to the forefeet that we'll be evaluated with x-rays with nail disease and pain and significant circulatory disease    Plan:     H&P and x-rays reviewed. Today I debrided nailbeds 1-5 of both feet and we are going to send for vascular evaluation with scheduling performed today. We did schedule for diabetic shoes and we'll get approval as she is long-term at risk for ulceration and other issues

## 2014-02-03 ENCOUNTER — Encounter: Payer: Self-pay | Admitting: Podiatry

## 2014-02-07 ENCOUNTER — Other Ambulatory Visit: Payer: Self-pay

## 2014-03-21 ENCOUNTER — Other Ambulatory Visit: Payer: Medicare Other

## 2014-04-03 LAB — APTT: ACTIVATED PTT: 29 s (ref 23.6–35.9)

## 2014-04-03 LAB — URINALYSIS, COMPLETE
Bacteria: NONE SEEN
Bilirubin,UR: NEGATIVE
Blood: NEGATIVE
GLUCOSE, UR: NEGATIVE mg/dL (ref 0–75)
Hyaline Cast: 3
Ketone: NEGATIVE
Leukocyte Esterase: NEGATIVE
Nitrite: NEGATIVE
Ph: 5 (ref 4.5–8.0)
Protein: NEGATIVE
Specific Gravity: 1.008 (ref 1.003–1.030)
Squamous Epithelial: <1
WBC UR: 3 /HPF (ref 0–5)

## 2014-04-03 LAB — COMPREHENSIVE METABOLIC PANEL
ALBUMIN: 2.1 g/dL — AB (ref 3.4–5.0)
ANION GAP: 9 (ref 7–16)
Alkaline Phosphatase: 58 U/L
BUN: 15 mg/dL (ref 7–18)
Bilirubin,Total: 0.3 mg/dL (ref 0.2–1.0)
CALCIUM: 5.4 mg/dL — AB (ref 8.5–10.1)
CHLORIDE: 121 mmol/L — AB (ref 98–107)
CO2: 18 mmol/L — AB (ref 21–32)
Creatinine: 0.53 mg/dL — ABNORMAL LOW (ref 0.60–1.30)
EGFR (African American): >60
Glucose: 47 mg/dL — ABNORMAL LOW (ref 65–99)
OSMOLALITY: 292 (ref 275–301)
POTASSIUM: 2.6 mmol/L — AB (ref 3.5–5.1)
SGOT(AST): 36 U/L (ref 15–37)
SGPT (ALT): 16 U/L
SODIUM: 148 mmol/L — AB (ref 136–145)
TOTAL PROTEIN: 4.4 g/dL — AB (ref 6.4–8.2)

## 2014-04-03 LAB — MAGNESIUM: Magnesium: 1.8 mg/dL

## 2014-04-03 LAB — CBC WITH DIFFERENTIAL/PLATELET
BASOS PCT: 1.3 %
Basophil #: 0.1 10*3/uL (ref 0.0–0.1)
Eosinophil #: 0.2 10*3/uL (ref 0.0–0.7)
Eosinophil %: 2.9 %
HCT: 36.9 % (ref 35.0–47.0)
HGB: 11.9 g/dL — AB (ref 12.0–16.0)
Lymphocyte #: 1.2 10*3/uL (ref 1.0–3.6)
Lymphocyte %: 18.7 %
MCH: 28.1 pg (ref 26.0–34.0)
MCHC: 32.4 g/dL (ref 32.0–36.0)
MCV: 87 fL (ref 80–100)
MONOS PCT: 10.4 %
Monocyte #: 0.7 x10 3/mm (ref 0.2–0.9)
NEUTROS ABS: 4.4 10*3/uL (ref 1.4–6.5)
NEUTROS PCT: 66.7 %
Platelet: 137 10*3/uL — ABNORMAL LOW (ref 150–440)
RBC: 4.25 10*6/uL (ref 3.80–5.20)
RDW: 15.3 % — ABNORMAL HIGH (ref 11.5–14.5)
WBC: 6.6 10*3/uL (ref 3.6–11.0)

## 2014-04-03 LAB — PROTIME-INR
INR: 0.9
Prothrombin Time: 12 secs (ref 11.5–14.7)

## 2014-04-03 LAB — TROPONIN I: TROPONIN-I: 0.15 ng/mL — AB

## 2014-04-04 ENCOUNTER — Ambulatory Visit
Admit: 2014-04-04 | Disposition: A | Discharge status: Home / Self Care | Payer: Self-pay | Attending: Nurse Practitioner | Admitting: Nurse Practitioner

## 2014-04-04 ENCOUNTER — Inpatient Hospital Stay: Payer: Self-pay | Admitting: Internal Medicine

## 2014-04-04 LAB — CBC WITH DIFFERENTIAL/PLATELET
Basophil #: 0 10*3/uL (ref 0.0–0.1)
Basophil %: 0.2 %
EOS PCT: 2.1 %
Eosinophil #: 0.1 10*3/uL (ref 0.0–0.7)
HCT: 35.8 % (ref 35.0–47.0)
HGB: 11.6 g/dL — ABNORMAL LOW (ref 12.0–16.0)
LYMPHS ABS: 0.9 10*3/uL — AB (ref 1.0–3.6)
LYMPHS PCT: 18.7 %
MCH: 28.4 pg (ref 26.0–34.0)
MCHC: 32.5 g/dL (ref 32.0–36.0)
MCV: 87 fL (ref 80–100)
MONO ABS: 0.5 x10 3/mm (ref 0.2–0.9)
Monocyte %: 11.2 %
NEUTROS ABS: 3.2 10*3/uL (ref 1.4–6.5)
NEUTROS PCT: 67.8 %
Platelet: 109 10*3/uL — ABNORMAL LOW (ref 150–440)
RBC: 4.1 10*6/uL (ref 3.80–5.20)
RDW: 15.5 % — ABNORMAL HIGH (ref 11.5–14.5)
WBC: 4.8 10*3/uL (ref 3.6–11.0)

## 2014-04-04 LAB — LIPID PANEL
CHOLESTEROL: 157 mg/dL (ref 0–200)
HDL Cholesterol: 67 mg/dL — ABNORMAL HIGH (ref 40–60)
Ldl Cholesterol, Calc: 74 mg/dL (ref 0–100)
Triglycerides: 81 mg/dL (ref 0–200)
VLDL Cholesterol, Calc: 16 mg/dL (ref 5–40)

## 2014-04-04 LAB — BASIC METABOLIC PANEL
Anion Gap: 9 (ref 7–16)
BUN: 22 mg/dL — ABNORMAL HIGH (ref 7–18)
CHLORIDE: 106 mmol/L (ref 98–107)
CO2: 25 mmol/L (ref 21–32)
Calcium, Total: 8.7 mg/dL (ref 8.5–10.1)
Creatinine: 1.17 mg/dL (ref 0.60–1.30)
EGFR (African American): 57 — ABNORMAL LOW
EGFR (Non-African Amer.): 47 — ABNORMAL LOW
Glucose: 310 mg/dL — ABNORMAL HIGH (ref 65–99)
Osmolality: 294 (ref 275–301)
Potassium: 4.5 mmol/L (ref 3.5–5.1)
Sodium: 140 mmol/L (ref 136–145)

## 2014-04-04 LAB — MAGNESIUM: Magnesium: 1.7 mg/dL — ABNORMAL LOW

## 2014-04-04 LAB — TROPONIN I
TROPONIN-I: 0.24 ng/mL — AB
Troponin-I: 0.21 ng/mL — ABNORMAL HIGH

## 2014-04-05 LAB — CBC WITH DIFFERENTIAL/PLATELET
Basophil #: 0.1 10*3/uL (ref 0.0–0.1)
Basophil %: 1.9 %
EOS ABS: 0.2 10*3/uL (ref 0.0–0.7)
EOS PCT: 4.6 %
HCT: 37.3 % (ref 35.0–47.0)
HGB: 12.3 g/dL (ref 12.0–16.0)
LYMPHS ABS: 0.8 10*3/uL — AB (ref 1.0–3.6)
Lymphocyte %: 21.1 %
MCH: 28.4 pg (ref 26.0–34.0)
MCHC: 32.9 g/dL (ref 32.0–36.0)
MCV: 86 fL (ref 80–100)
MONO ABS: 0.5 x10 3/mm (ref 0.2–0.9)
Monocyte %: 11.6 %
NEUTROS PCT: 60.8 %
Neutrophil #: 2.4 10*3/uL (ref 1.4–6.5)
PLATELETS: 119 10*3/uL — AB (ref 150–440)
RBC: 4.32 10*6/uL (ref 3.80–5.20)
RDW: 15.4 % — AB (ref 11.5–14.5)
WBC: 4 10*3/uL (ref 3.6–11.0)

## 2014-04-05 LAB — COMPREHENSIVE METABOLIC PANEL
ALK PHOS: 86 U/L
ALT: 28 U/L
Albumin: 3.3 g/dL — ABNORMAL LOW (ref 3.4–5.0)
Anion Gap: 4 — ABNORMAL LOW (ref 7–16)
BUN: 22 mg/dL — ABNORMAL HIGH (ref 7–18)
Bilirubin,Total: 0.7 mg/dL (ref 0.2–1.0)
CHLORIDE: 102 mmol/L (ref 98–107)
CO2: 32 mmol/L (ref 21–32)
Calcium, Total: 8.7 mg/dL (ref 8.5–10.1)
Creatinine: 0.95 mg/dL (ref 0.60–1.30)
GFR CALC NON AF AMER: 59 — AB
Glucose: 254 mg/dL — ABNORMAL HIGH (ref 65–99)
Osmolality: 288 (ref 275–301)
Potassium: 3.8 mmol/L (ref 3.5–5.1)
SGOT(AST): 38 U/L — ABNORMAL HIGH (ref 15–37)
Sodium: 138 mmol/L (ref 136–145)
Total Protein: 6.5 g/dL (ref 6.4–8.2)

## 2014-04-08 ENCOUNTER — Encounter: Payer: Self-pay | Admitting: Internal Medicine

## 2014-05-05 ENCOUNTER — Encounter: Payer: Self-pay | Admitting: Internal Medicine

## 2014-06-03 ENCOUNTER — Encounter
Admit: 2014-06-03 | Disposition: A | Discharge status: Home / Self Care | Payer: Self-pay | Attending: Internal Medicine | Admitting: Internal Medicine

## 2014-07-04 ENCOUNTER — Encounter
Admit: 2014-07-04 | Disposition: A | Discharge status: Home / Self Care | Payer: Self-pay | Attending: Internal Medicine | Admitting: Internal Medicine

## 2014-07-24 ENCOUNTER — Emergency Department
Admit: 2014-07-24 | Disposition: A | Discharge status: Home / Self Care | Payer: Self-pay | Admitting: Emergency Medicine

## 2014-07-24 LAB — URINALYSIS, COMPLETE
BACTERIA: NONE SEEN
BILIRUBIN, UR: NEGATIVE
KETONE: NEGATIVE
Nitrite: NEGATIVE
Ph: 6 (ref 4.5–8.0)
Protein: NEGATIVE
SPECIFIC GRAVITY: 1.027 (ref 1.003–1.030)

## 2014-07-24 LAB — COMPREHENSIVE METABOLIC PANEL
ANION GAP: 6 — AB (ref 7–16)
Albumin: 4.1 g/dL
Alkaline Phosphatase: 106 U/L
BILIRUBIN TOTAL: 0.8 mg/dL
BUN: 23 mg/dL — ABNORMAL HIGH
CHLORIDE: 99 mmol/L — AB
CO2: 31 mmol/L
CREATININE: 0.83 mg/dL
Calcium, Total: 9.5 mg/dL
GLUCOSE: 395 mg/dL — AB
Potassium: 4.2 mmol/L
SGOT(AST): 23 U/L
SGPT (ALT): 18 U/L
SODIUM: 136 mmol/L
Total Protein: 7.4 g/dL

## 2014-07-24 LAB — CBC
HCT: 35.3 % (ref 35.0–47.0)
HGB: 11.7 g/dL — AB (ref 12.0–16.0)
MCH: 28.6 pg (ref 26.0–34.0)
MCHC: 33.1 g/dL (ref 32.0–36.0)
MCV: 86 fL (ref 80–100)
Platelet: 151 10*3/uL (ref 150–440)
RBC: 4.09 10*6/uL (ref 3.80–5.20)
RDW: 14.5 % (ref 11.5–14.5)
WBC: 6 10*3/uL (ref 3.6–11.0)

## 2014-07-24 LAB — TROPONIN I: Troponin-I: 0.06 ng/mL — ABNORMAL HIGH

## 2014-07-25 NOTE — Consult Note (Signed)
PATIENT NAME:  Mary Lambert, Mary Lambert MR#:  [id number] DATE OF BIRTH:  18-May-1925  DATE OF CONSULTATION:  09/07/2012  REFERRING PHYSICIAN:  Dr. Tressia Miners CONSULTING PHYSICIAN:  Heinz Knuckles. Camary Sosa, MD  REASON FOR CONSULT:  Endocarditis.  HISTORY OF PRESENT ILLNESS:  The patient is an 81 year old female with a past history significant for breast cancer status post right mastectomy, aortic stenosis status post porcine aortic valve replacement and diabetes who was admitted on 06/03 with bilateral lower extremity edema and poorly controlled diabetes.  The patient states that her blood sugars had been ranging anywhere from 25 to 500 and that she was feeling poorly at times, especially with low blood sugars. She was brought to the Emergency Room and was given D50 and her blood sugar improved. She was found to have evidence for worsening CHF and was admitted to the hospital. She underwent echocardiogram and TEE which demonstrated evidence for a mitral valve vegetation, as well as some possible destruction of the mitral valve itself.  The aortic valve replacement did not appear to have any evidence for infection. She was started on Levaquin on admission due to some leukocyte esterase in her urine. Urine cultures have grown 15,000 CFUs per mL of an organism and no blood cultures were obtained at that time.  After the discovery of the vegetations, two sets of blood cultures were obtained while she was on Levaquin.  These are negative to date.  The patient denied any fevers, chills or sweats. She has had no nausea or vomiting. She states that she had some increased peripheral edema. She has also had some increased urinary frequency, but has been taking Lasix. She has had some occasional abdominal discomfort, but no other bowel symptoms. No flank pain.  ALLERGIES:  INCLUDE HYDROCODONE, PENICILLIN, SULFA AND TAPE.  PAST MEDICAL HISTORY: 1.  Aortic stenosis status post porcine aortic valve replacement. 2.  Coronary artery  disease, status post stent placement. 3.  Diabetes. 4.  Hypertension. 5.  Hypothyroidism. 6.  Hypercholesterolemia. 7.  Peripheral vascular disease with renal artery stenosis. 8.  Status post carotid endarterectomy. 9.  Breast cancer on the right status post mastectomy and chemotherapy in 2011. 10.  CHF. 11.  Osteoarthritis. 12.  Osteoporosis. 13.  COPD. 14.  GERD. 15.  Status post hysterectomy.  SOCIAL HISTORY:  The patient lives with her husband. She is a prior smoker having quit 40 years ago. She does not drink.  No injected drug use history.  FAMILY HISTORY:  Positive for diabetes, hypertension and coronary artery disease.  REVIEW OF SYSTEMS: GENERAL:  No fevers, chills or sweats. Some malaise, especially when her sugars are particularly low.  Positive fatigue with low sugars. HEENT:  No headaches.  No sinus congestion.  No sore throat. NECK:  No stiffness.  No swollen glands. RESPIRATORY:  No cough.  No shortness of breath.  No sputum motion. CARDIAC:  No chest pains.  No palpitations.  She does have peripheral edema bilaterally. GASTROINTESTINAL:  No nausea. No vomiting.  She has some suprapubic abdominal pain, but no flank pain.  She has no change in her bowels. GENITOURINARY:  No dysuria. No hematuria. Some increased frequency with Lasix use. MUSCULOSKELETAL:  She has pain in her knees and her feet, which she attributes to her osteoarthritis. No focal weakness. PSYCHIATRIC:  No complaints.  All other systems are negative.  PHYSICAL EXAMINATION: VITAL SIGNS:  T-max of 98.6, T-current of 98.3, pulse 86, blood pressure 113/70, 100% on room air. GENERAL:  An 81 year old  white female in no acute distress. HEENT:  Normocephalic, atraumatic.  Pupils equal and react to light.  Extraocular motion intact.  Sclerae, conjunctivae and lids without evidence for emboli or petechiae.  Oropharynx shows no erythema or exudate.  Gums are in fair condition. NECK:  Supple.  Full range of  motion.  Midline trachea.  No lymphadenopathy.  No thyromegaly. CHEST:  Clear to auscultation bilaterally with good air movement.  No focal consolidation. CARDIAC:  Regular rate and rhythm with 5/6 systolic murmur heard best along the left sternal border. No peripheral edema present. ABDOMEN:  Soft, nontender, nondistended.  No hepatosplenomegaly.  No hernias noted. EXTREMITIES:  No evidence for tenosynovitis. SKIN:  No rashes.  No stigmata of endocarditis, specifically no Janeway lesions or Osler nodes.  She had multiple ecchymoses, however. NEUROLOGIC:  The patient was awake and interactive moving all 4 extremities. PSYCHIATRIC:  Mood and affect appeared normal.  LABORATORY DATA:  BUN 30, creatinine 0.94, bicarbonate of 27, anion gap of 7. LFTs from admission were unremarkable.  White count is 5.0 with a hemoglobin 12.9, platelet count of 152, ANC of 3.3.  White count on admission was 5.0.  Urinalysis on admission had negative nitrites, 3+ leukocyte esterase, 2 red cells per high-powered field, 71 white cells per high-powered field.  Urine culture is growing 15,000 CFUs per mL gram-positive cocci.  Blood cultures from 06/05 show no growth.  A chest x-ray from admission shows bilateral diffuse interstitial thickening.  Chest x-ray from 06/05 showed a large amount of pleural fluid on the right. No pulmonary vascular congestion.  No evidence for pneumonia.  A transthoracic echocardiogram showed an EF of 60% to 65%.  Mild concentric left ventricular hypertrophy, increased left ventricular septal thickness, mild mitral valve regurgitation, moderate to severe aortic valve stenosis, mild increased left ventricular posterior wall thickness.  There was some tricuspid regurgitation as well. There is no note of vegetation.  A TEE demonstrated severe MR and small vegetation in the mitral valve.  The aortic valve was functioning normally.  There was mild tricuspid regurgitation.  IMPRESSION:  An 81 year old  female with a history of breast cancer status post right mastectomy, aortic stenosis status post porcine aortic valve replacement and diabetes, was admitted with mitral valve endocarditis.  RECOMMENDATIONS: 1.  On admission she was having poorly controlled blood sugars.  Her UA had some leukocyte esterase and she was started on levofloxacin.  Her urine ultimately was negative, but vegetation was found in the mitral valve suggesting endocarditis. 2.  Blood cultures have been obtained but not prior to starting antibiotics.  This may cause them to be negative. 3. Treatment for culture-negative endocarditis includes vancomycin, gentamicin and ciprofloxacin.  We will change her regimen to this now. 4.  If the blood cultures identify an organism, we will plan on narrowing the spectrum.  Typical organisms which are thought to be contaminants would have to be considered as pathogens given her echo findings. 5.  We will ask the pharmacy to dose the antibiotics.  The gentamicin should not be daily dosing unless required by her renal function. 6.  She will likely need 4 weeks of IV therapy (except for the Cipro, which can be p.o.). 7.  We will place a PICC line (single lumen) once her blood cultures are known to be negative. 8. We will need to monitor her renal function closely due to nephrotoxicity.  Antibiotic regimen is complicated and she may need placement for the time on therapy. 9.  We  will send an ESR. 10. Given the toxicity of therapy and the likelihood of negative cultures, we may end up needing to treat her and put her kidneys at significant risk.  If an old echo shows a similar vegetation present, then it may be prudent to stop all therapy and check blood cultures repeatedly.  I would not do this however, unless it can be demonstrated the findings on the mitral valve are not new.  This is a highly complex infectious disease case.  Thank you much for involving me in Mary Lambert's  care.   ____________________________ Heinz Knuckles. Kazi Reppond, MD meb:ce D: 09/07/2012 15:28:00 ET T: 09/07/2012 16:30:08 ET JOB#: [id number]  cc: Heinz Knuckles. Shanicqua Coldren, MD, <Dictator> Tenesha Garza E Angeliki Mates MD ELECTRONICALLY SIGNED 09/10/2012 13:45

## 2014-07-25 NOTE — Consult Note (Signed)
PATIENT NAME:  Mary Lambert, WAID MR#:  [id number] DATE OF BIRTH:  February 05, 1926  DATE OF CONSULTATION:  09/07/2012  REFERRING PHYSICIAN:  Gladstone Lighter, MD CONSULTING PHYSICIAN:  A. Lavone Orn, MD  PRIMARY CARE PHYSICIAN: Benita Stabile, MD   CHIEF COMPLAINT: Uncontrolled diabetes.   HISTORY OF PRESENT ILLNESS: This is an 81 year old female seen in consultation at the request of Dr. Tressia Miners for uncontrolled diabetes. She has had diabetes for over 20 years and has been on insulin for over 15 years. She was admitted with shortness of breath and has been found to have acute on chronic congestive heart failure. History is notable for insulin-dependent diabetes in addition to CHF and history of bioprosthetic aortic valve repair. She continues to have shortness of breath. She is eating very little as she is feeling poorly. Blood sugars have been highly variable and in the last 24 hours have ranged from 39 to 427. Her outpatient diabetes regimen includes Lantus 22 units at bedtime and Humalog 6 units t.i.d. before meals. Due to frequent hypoglycemia prior to admission, she had several ED visits. On the day of admission for this hospitalization, 09/04/2012, her initial blood glucose was 42. She has not received any basal insulin in the last 24 hours. She has received a total of 34 units of fast acting insulin, mixture of either Regular or NovoLog.   PAST MEDICAL HISTORY: 1.  Insulin-dependent diabetes.  2.  Coronary artery disease.  3.  Hypertension.  4.  Hyperlipidemia.  5.  History of aortic valve repair.  6.  Peripheral vascular disease status post right CEA.  7.  COPD. 8.  Hypothyroidism.  9.  History of right breast cancer status post mastectomy, 2011.  10.  Osteoarthritis.  11.  Osteoporosis. 12.  GERD.   PAST SURGICAL HISTORY:  1.  Right mastectomy.  2.  Right carotid endarterectomy.  3.  Hysterectomy.  4.  PCI with stent placement, 08/2012.  5.  Aortic valve replacement.   SOCIAL  HISTORY: The patient is married. She lives with her husband. She denies use of tobacco or alcohol.   FAMILY HISTORY: A grandparent had diabetes.   CURRENT MEDICATIONS: 1.  NovoLog insulin sliding scale.  2.  Letrozole 2.5 mg daily.  3.  Furosemide 40 mg b.i.d.  4.  Gentamicin 65 mg daily.  7.  Guaifenesin 600 mg b.i.d.  8.  Lisinopril 5 mg daily.  9.  Metoprolol ER 50 mg daily.  10.  Ditropan 5 mg b.i.d.  11.  Aspirin 81 mg daily.  12.  Plavix 75 mg daily.  13.  Spiriva 1 capsule daily.   ALLERGIES: SULFA, PENICILLIN, SEPTRA.   REVIEW OF SYSTEMS: GENERAL: Denies weight loss. Denies fevers.  HEENT: She does report blurred vision, she uses corrective lenses and this is chronic, not new. Denies sore throat.  NECK: Denies neck pain.  Denies dysphasia.  CARDIAC: Denies chest pain. Denies palpitations.  PULMONARY: Reports shortness of breath and cough.  ABDOMEN: Denies abdominal pain. She does report nausea without emesis.  GENITOURINARY: She denies dysuria or hematuria.  NEUROLOGIC: She denies numbness in the feet. She denies paresthesias. Denies headache. SKIN: She denies rash or recent skin changes.  EXTREMITIES: She reports leg edema. She denies focal weakness.   PHYSICAL EXAMINATION: VITAL SIGNS: Height 61.9 inches, weight 147 pounds, BMI 27. Temperature 98.3, pulse 86, respirations 18, blood pressure 113/70, pulse ox 100% on room air.  GENERAL: Elderly white female in no acute distress.  HEENT: EOMI. Oropharynx is clear.  NECK: Supple. No appreciable thyromegaly.  HEART:  Regular rate and rhythm. She is a holosystolic murmur. 2+ DP pulses.  LUNGS:  Clear bilaterally. No wheeze.  ABDOMEN: Diffusely soft, nontender, nondistended.  EXTREMITIES: No peripheral edema is appreciated.  NEUROLOGIC: EOMI.  No appreciable cranial nerve deficits.  SKIN: No rash or dermatopathy noted.  PSYCHIATRIC: Alert and oriented, calm, cooperative.  LYMPH: No submandibular or anterior cervical  lymphadenopathy.  LABORATORY DATA:  Glucose 197, BUN 30, creatinine 0.94, sodium 135, potassium 3.2. Hematocrit 38.1%.  On 09/05/2012 hemoglobin A1c 7.5%.   ASSESSMENT: An 81 year old female admitted with acute on chronic congestive heart failure, history of aortic valve replacement with question of vegetations on her valves, now being treated with antibiotics for possible endocarditis, additionally with widely fluctuating blood sugars and poor appetite.   RECOMMENDATIONS: 1.  Recommend giving daily basal insulin. Will initiate Lantus 15 units daily now to be given every 24 hours. Should she have fasting low sugars on this regimen, I would decrease it by 2 to 3 units, however, if fasting sugars are over 200, would increase by 2 units.  2.  Give prandial Humalog. She is eating although eating very small amounts. Start with 4 units before meals. Again, would taper down insulin by 1 to 2 units before meals if she does have post-prandial hypoglycemia.  3.  Cover with Humalog sliding scale, put on a regimen of 1 unit per sugar of 50 over a target of 175. Recommend avoiding hypoglycemia at all costs given her age and comorbidities.   I am unavailable over this weekend, however, if the patient remains in-house on Monday, I will see her at that time. I did talk to her about long-term outpatient follow-up.  She declined follow up with me and prefers to see Dr. Hall Busing, her PCP for diabetes management.  ____________________________ A. Lavone Orn, MD ams:sb D: 09/07/2012 13:47:01 ET T: 09/07/2012 15:22:44 ET JOB#: [id number]  cc: A. Lavone Orn, MD, <Dictator> Sherlon Handing MD ELECTRONICALLY SIGNED 09/13/2012 17:32

## 2014-07-25 NOTE — Consult Note (Signed)
Impression:   81yo female w/ h/o breast CA, s/p right mastectomy, AS, s/p porcine AVR and DM admitted with mitral valve endocarditis.   On admission, she was having poorly controlled BS.  Her u/a had some leukocyte esterase and she was started on levofloxacin.  Her urine was ultimately negative, but a vegetation was found on the MV suggesting endocarditis.  Blood cultures have been obtained, but not prior to starting antibiotics.  This may cause them to be negative.  Treatment for culture negative endocarditis includes vanco, gent and cipro.  Will change her regimen to this now.  If the BCx identify an organism, will plan on narrowing the spectrum.  Typical organisms usually thought to be contaminants would have to be considered as pathogens, given her echo findings.  Will ask pharmacy to dose the antibiotics.  The gentamicin should not by daily dosing unless required by her renal function.  Will likely need 4 weeks of IV therapy (except the cipro which can be PO).  Will place a PICC line (SINGLE LUMEN) once her BCx are known to be negative.  Will need to monitor her renal function closely due to nephrotoxicity.  The antibiotic regimen is complicated and she may need placement for the time on therapy. 9)   Will send an ESR. 10)  Given the toxicity of therapy and the likelihood of negative cultures, we may end up needing to treat.  If an old echo shows a similar vegetation present, then it may be prudent to stop all therapy and check BCx repeatedly.  I would not be comfortable doing this unless the MV findings were proven to be long standing.  Electronic Signatures: Vernelle Wisner MPH, Heinz Knuckles (MD) (Signed on 06-Jun-14 15:16)  Authored   Last Updated: 06-Jun-14 15:29 by Sesar Madewell MPH, Heinz Knuckles (MD)

## 2014-07-25 NOTE — Discharge Summary (Signed)
PATIENT NAME:  Mary Lambert, Mary Lambert MR#:  [id number] DATE OF BIRTH:  Apr 29, 1925  DATE OF ADMISSION:  09/04/2012 DATE OF DISCHARGE:  09/11/2012  CONSULTANTS: Dr. Gabriel Carina from endocrinology; Dr. Nehemiah Massed and Dr. Ubaldo Glassing from cardiology; Dr. Clayborn Bigness from infectious disease.   CHIEF COMPLAINT:  Leg swelling and low blood sugars.   PRIMARY CARE PHYSICIAN: Dr. Benita Stabile.   DISCHARGE DIAGNOSES: 1.  Acute-on-chronic diastolic congestive heart failure, secondary to severe mitral regurgitation.  2.  Mitral valve; culture-negative endocarditis.  3.  Brittle diabetes.  4.  Coronary artery disease, status post recent stenting.  5.  Hypertension.  6.  Urinary tract infection.  7.  Hypothyroid state.  8.  Hyperlipidemia.  9.  Peripheral vascular disease, with renal artery stenosis and previous carotid endarterectomy.  10.  History of valvular heart disease with a bioprosthetic aortic valve replacement.  11.  History of right breast cancer, status post mastectomy in 2011.  12.  Chronic diastolic congestive heart failure.  13.  Osteoarthritis.  14.  Osteoporosis.  15. Chronic obstructive pulmonary disease, not on oxygen.  16.  History of recurrent urinary tract infections.  17.  Gastroesophageal reflux disease.   DISCHARGE MEDICATIONS: Cipro 500 mg p.o. q.12 hours, vancomycin 750 mg q.12 hours gentamicin and 80 mg IV q.24 hours; all of these are to be taken until 10/03/2012.   1.  Lasix 40 mg 2 times a day.  2.  Lantus 22 units at bedtime.  3.  Insulin, Lispro, 5 units t.i.d. with meals.  4.  Letrozole 2.5 mg daily.  5.  Aspirin 81 mg daily.  6.  Plavix 75 mg daily.  7.  Oxybutynin 5 mg 2 times a day.  8.  Nitrostat 0.4 mg sublingual every 5 minutes as needed for chest pain.  9.  Temazepam 15 mg at bedtime.  10. Alendronate 70 mg once a week, on Sundays.  11.  Amlodipine 10 mg daily.  12.  Fexofenadine 180 mg daily.  13.  Spiriva 18 mcg inhaled, 1 capsule daily.  14.  Metoprolol succinate 50 mg,  1 tablet once a day.  15.  Lisinopril 5 mg daily.  16.  Simvastatin 20 mg daily.  17.  Levothyroxine 112 mcg daily.   DIET: Low sodium.   ACTIVITY: As tolerated.   Please follow with PCP, Dr. Clayborn Bigness and Dr. Nehemiah Massed within 1 to 2 weeks. Check kidney function test weekly and send to Dr. Clayborn Bigness.   DISPOSITION: To rehab.   HISTORY OF PRESENT ILLNESS AND HOSPITAL COURSE: For full details of H and P, please see the dictation admission and history done June 3, but briefly this is a pleasant 81 year old with multiple medical issues including CAD status post recent stent placement, diabetes, hyperlipidemia, hypertension, hypothyroidism, chronic diastolic CHF, who came in with low blood sugars. Was also complaining of lower extremity swelling and some shortness of breath, and was admitted to the hospitalist service for acute-on-chronic diastolic CHF, and cardiology was consulted with her cardiologist. She was started on IV diuretics and felt better. An echocardiogram was ordered which showed an EF of 60% to 65%. She was seen by Dr. Ubaldo Glassing. She was noted to have a very loud systolic murmur, and concern was for a possible progression of her valvular disease. A TEE was done to evaluate the aortic prosthetic valve and also the mitral valve, but the TEE picked up 2 small vegetations on the mitral valve, with severe mitral valvular regurgitation on June 5th. Ejection fraction was noted to be  55% to 60%. Infectious disease was consulted and cultures were sent. Thus far the cultures have been negative x 3 from blood from the 6th of June as well as the 5th of June. She was started on vancomycin, and gentamicin, and Cipro, the latter p.o. and the first two through IV. Of note, the patient was started on Levaquin for a presumed UTI  on arrival, and the blood cultures were sent post-Levaquin, and it is a possibility that therefore the blood cultures remained negative. Nonetheless, she is to be treated with IV antibiotics of  vancomycin and gentamicin for four weeks total. A PICC line has been placed. She has no fever, nor does she have a white count. She should follow with Dr. Clayborn Bigness and Dr. Nehemiah Massed as an outpatient and check GFR weekly. She is on several nephrotoxic agents. She will be discharged to [facility] in this regard.   In regards to the acute-on-chronic diastolic CHF she has been transitioned to p.o. Lasix. At this point has not been short of breath or acute volume overload state, and her Lasix has been transitioned into p.o.   In regards to her diabetes, Dr. Gabriel Carina has evaluated the patient and her Lantus has been adjusted as well as her Lispro a.c. t.i.d. Blood sugars are labile, and her hemoglobin A1c is 7.5. She can follow with Dr. Gabriel Carina as an outpatient as well if she needs to. Her last BUN is 19 and creatinine of 0.7.   Total time spent: Thirty-five minutes.   THE PATIENT IS FULL CODE     ____________________________ Vivien Presto, MD sa:dm D: 09/11/2012 10:47:56 ET T: 09/11/2012 12:03:39 ET JOB#: [id number]  cc: Vivien Presto, MD, <Dictator> Vivien Presto MD ELECTRONICALLY SIGNED 09/30/2012 22:10

## 2014-07-25 NOTE — H&P (Signed)
PATIENT NAME:  Mary Lambert, Mary Lambert MR#:  [id number] DATE OF BIRTH:  May 21, 1925  DATE OF ADMISSION:  09/04/2012  PRIMARY CARE PHYSICIAN:  Dr. Benita Stabile.  CARDIOLOGIST:  Dr. Nehemiah Massed.   REFERRING EMERGENCY ROOM PHYSICIAN:  Dr. Ferman Hamming.   CHIEF COMPLAINT:  Leg swelling and hypoglycemia.   HISTORY OF PRESENT ILLNESS:  The patient is an 81 year old female with multiple past medical problems including coronary artery disease status post recent stent placement, diabetes, hyperlipidemia, hypertension, hypothyroidism, peripheral vascular disease and previous carotid endarterectomy, bioprosthetic aortic valve replacement, breast cancer status post mastectomy, chronic diastolic failure, recurrent urinary tract infection, obstructive pulmonary disease who has her insulin-dependent diabetes mellitus and taking 22 units of Lantus and 6 units of regular insulin 3 times a day before meals depending on the blood sugar level, noticed today her blood sugar level was 500 and so she took 10 units of insulin instead of her 6 units and after a few hours noticed that blood sugar level is 25, which is unexpectedly low for her and so she called EMS and came to the Emergency Room.  On arrival to ER, she was given D50 and her blood sugar level came up to 100.  On initial work-up in the ER, she was found having elevated BNP and she also had complaint of leg swelling for the last few days.  Denied any other complaint and so being admitted for CHF.   REVIEW OF SYSTEMS:  CONSTITUTIONAL:  Negative for fever, fatigue, weakness or pain.  EYES:  No blurring, double vision, redness.  EARS, NOSE, THROAT:  No tinnitus, ear pain or hearing loss.  RESPIRATORY:  Denies any cough or wheezing, but has mild shortness of breath.  CARDIOVASCULAR:  No chest pain, orthopnea, arrhythmia or palpitations.  GASTROINTESTINAL:  No nausea, vomiting, diarrhea or abdominal pain.  GENITOURINARY:  No dysuria, hematuria or increased frequency.   ENDOCRINE:  No heat or cold intolerance.  SKIN:  No acne or rashes.  MUSCULOSKELETAL:  No pain or swelling in the joints, but swelling on the legs present.  NEUROLOGICAL:  No numbness, weakness, dysarthria, or epilepsy or headache.   PAST MEDICAL HISTORY:   1.  Coronary artery disease status post recent cardiac catheterization last month at [facility] and stent placement.  2.  Diabetes, insulin-dependent.  3.  Hypertension.  4.  Hypothyroid.  5.  Hyperlipidemia.  6.  Peripheral vascular disease with renal artery stenosis and previous carotid endarterectomy.  7.  Valvular heart disease with bioprosthetic aortic valve replacement.  8.  History of right breast cancer status post mastectomy in 2011.  9.  Chronic diastolic heart failure.  10.  Osteoarthritis.  11.  Osteoporosis.  12.  Chronic obstructive pulmonary disease, not on oxygen.  13.  Recurrent urinary tract infection.  14.  Gastroesophageal reflux disease.   PAST SURGICAL HISTORY:  Right mastectomy, right carotid endarterectomy and hysterectomy.   SOCIAL HISTORY:  She quit tobacco 40 years ago.  No alcohol or drug use.  She lives with her husband.   FAMILY HISTORY:  Positive for diabetes and hypertension and heart disease.   MEDICATIONS AT HOME:  1.  Advair 1 puff 2 times a day.  2.  Alendronate 70 mg once a week.  3.  Amlodipine 10 mg oral tablet once a day.  4.  Fexofenadine 118 mg oral tablet once a day.  5.  Furosemide 40 mg once a day.  6.  Gabapentin 400 mg every 8 hours.  7.  Humalog 6  units 3 times a day with meals.  8.  Lantus 22 units once a day at bedtime.  9.  Levothyroxine 112 mcg once a day.  10.  Lisinopril 5 mg once a day.  11.  Metoprolol 50 mg once a day.  12.  Montelukast 10 mg oral tablet once a day.  13.  Nexium 40 mg once a day.  14.  Ondansetron 4 mg oral tablet 3 times a day as needed for nausea and vomiting.  15.  Oxybutynin 5 mg oral tablet 2 times a day.  16.  Plavix 75 mg oral tablet once a day.   17.  Simvastatin 20 mg tablet once a day.  18.  Spiriva 18 mcg capsule once a day.  19.  Temazepam 15 mg oral capsule once a day.   PHYSICAL EXAMINATION: VITAL SIGNS:  In ER, temperature 98.5, pulse rate 82, respirations 18, blood pressure 119/57.  GENERAL:  Fully alert and oriented to time, place and person, no acute distress.  HEENT:  Head and neck atraumatic.  Conjunctivae pink.  Oral mucosa moist.  NECK:  Supple.  No JVD.  RESPIRATORY:  Bilateral equal air entry.  Few crepitations present.  Right-sided chest missing ribs and movement of the skin present with inspiration and expiration.  CARDIOVASCULAR:  S1, S2 present.  Irregular.   ABDOMEN:  Soft, nontender.  Bowel sounds present.  No organomegaly.  SKIN:  No rashes.  LEGS:  Pitting edema bilateral legs present.  NEUROLOGICAL:  Power 5 out of 5, moves all four limbs.  No tremors.   LABORATORY DATA:  Glucose on presentation was 42, which came up now to 114.  BNP was 1019.  BUN 19, creatinine 0.84, potassium 3.4, calcium 9.2, troponin 0.13.  WBC 5, hemoglobin 12.  Chest x-ray, PA and lateral, bilateral diffuse interstitial thickening, likely representing interstitial edema versus interstitial pneumonitis secondary to an infectious or inflammatory etiology.   ASSESSMENT AND PLAN:  An 81 year old female with past medical history of coronary artery disease, aortic valvular replacement, hypertension, hyperlipidemia, hypothyroid, came with hypoglycemia and found having leg edema and elevated BNP with congestion on chest x-ray.  1.  Congestive heart failure, possible diastolic.  We do not have recent echocardiogram in our system, so we would like to get echocardiogram done in this admission.  The patient has been followed by Dr. Nehemiah Massed in the hospital here, but her primary cardiologist is with [facility].  We will get Dr. Nehemiah Massed to come and see her over here and her echocardiogram to be reviewed.  Meanwhile, we will give her IV Lasix 40 mg twice  daily and we will continue other cardiac medication at this time.  2.  Hypertension.  We will continue baseline hypertensive medication, beta-blocker, ACE inhibitors and blood pressure to keep under control.  3.  Diabetes, insulin-dependent.  She presented with hypoglycemia so we will decrease her Lantus dose from 23 units to 15 units and we will skip tonight's dose and we will give it tomorrow.  4.  Hypothyroidism.  We will continue her thyroid replacement regimen.  5.  Chronic obstructive pulmonary disease.  No oxygen use.  No exacerbation symptoms.  At this time we will continue baseline treatment with bronchodilators.  6.  Hyperlipidemia.  We will continue statin.   Total time spent in this admission:  50 minutes.    ____________________________ Ceasar Lund Anselm Jungling, MD vgv:ea D: 09/04/2012 23:03:20 ET T: 09/04/2012 23:40:20 ET JOB#: [id number]  cc: Ceasar Lund. Anselm Jungling, MD, <Dictator> Leona Carry.  Hall Busing, MD Vaughan Basta MD ELECTRONICALLY SIGNED 09/17/2012 22:19

## 2014-07-25 NOTE — Consult Note (Signed)
Chief Complaint and History:  Referring Physician Dr. Tressia Miners   Chief Complaint uncontrolled diabetes   Allergies:  Sulfa drugs: Swelling  Septra: Other  PCN: Other  Hydrocodone: Unknown  Tape: Rash  Assessment/Plan:  Assessment/Plan 81 yo F with mulit med problems including CAD, prosthetic AV, and diabetes admitted with acute on chronic HF. Called to see patient for uncontrolled diabetes. I reviewed her regimen and patterns of blood sugars, which are variable and have ranged 39 - 427 in last 24 hrs. She is not eating much and appetite is poor. Home regimen is Lantus 22 units qHS and Humalog 6 units tid AC. She reports sugars are highly variable at home and due to hypoglycemia she has had several ED visits in the last few months.  A/ Uncontrolled insulin-dependent diabetes with hypoglycemia Acute on chronic HF CAD  P/ -Give daily basal insulin. Will start Lantus 15 units now and continue q24 hrs. -Give mealtime coverage with Humalog 4 units tid AC. -Change SSI to Humalog, 1 unit per 50 over a target of 175. -Aim for sugars in the 100-250 range with strict avoidance of hypoglycemia -If fasting sugars are low (<70) cut back Lantus by 2-3 units. If fastings are high (>200), increase by 2-3 units. -If post-meal sugars are low, cut back Humalog at meals to 2-3 units.  Full consult will be dictated. I will not be available over the weekend however if she remains hospitalized on Monday 6/9 then I will reassess at that time. Discussed out-pt follow-up with her however she declines and prefers to f/u with her PCP for diabetes mgmt as out-patient.   Case Discussed With patient   Electronic Signatures: Judi Cong (MD)  (Signed 06-Jun-14 13:09)  Authored: Chief Complaint and History, ALLERGIES, Assessment/Plan   Last Updated: 06-Jun-14 13:09 by Judi Cong (MD)

## 2014-07-25 NOTE — Consult Note (Signed)
   General Aspect This is an 81 year old female with history of aortic stenosis status post  carpentier-Edwards aortic valve replacement in 2005, peripheral vascular disease with bilateral renal artery stenosis and right carotid endarterectomy in 1998, iinsulinn dependent diabetes mellitus, hypertension, CAD with PCI and stent placement right coronary in January of 2011 as well as a non-ST elevation MI with critical stenosis of the ostium of the right coronary artery with drug eluding stent placement who underwent a pci at [facility] several weeks ago who was admitted after feeling weak and noting her serum glucose was very low after taking an insulin dose. Mary Lambert has some shortness of breath. She was compliant with her medicaitons and she presetned to the er where she was admitted. She ruled out for an mi. Echo revealed moderate stenosis of her prosthetic aortic prosthesis and difficult to assess mitral vavle Her ef was greater than 40% She is curenlty back to her baseline. She deneis dietary indescretion. She was felt to haveevidence of chf on cxr and was admitted. Her serum glucose has returned back to normal.   Physical Exam:  GEN well nourished, no acute distress   HEENT pink conjunctivae, PERRL, hearing intact to voice   NECK No masses   RESP normal resp effort  rhonchi   CARD Regular rate and rhythm  Murmur  Harch systollic murmur 5-5/3 in left upper sternal border   Murmur Systolic   Systolic Murmur Out flow   ABD normal BS   LYMPH negative neck, negative axillae   EXTR negative cyanosis/clubbing, negative edema   SKIN normal to palpation   NEURO cranial nerves intact, motor/sensory function intact   PSYCH alert, good insight   Review of Systems:  Subjective/Chief Complaint acute onset of shortness of breath   General: Weight loss or gain   Skin: No Complaints   ENT: No Complaints   Eyes: No Complaints   Neck: No Complaints   Respiratory: Short of breath    Cardiovascular: Dyspnea   Gastrointestinal: No Complaints   Genitourinary: No Complaints   Vascular: No Complaints   Musculoskeletal: No Complaints   Neurologic: No Complaints   Hematologic: No Complaints   Psychiatric: No Complaints   Medications/Allergies Reviewed Medications/Allergies reviewed   EKG:  EKG NSR   Abnormal NSSTTW changes    Sulfa drugs: Swelling  Septra: Other  PCN: Other  Hydrocodone: Unknown  Tape: Rash   Impression Pt with histoyr of aortic vavle replacement, cad s/p recent pci at [facility], diabetes melitus , pvd who was admitted after presenting to the er with complaints of hypoglycemia an dmild shortness of breath. Evaluation in the er with cxr sugggested possible chf. Echo revealed ef 60% with lvh with moderate steonsis of her aortic prosthesis and diffiicult to assess mitral vavle with mr. She has ruled out for an mi and is hemodynamiclaly. stable She has a very loud systollic murmur. COncern over possible progression of her valvular disease which is difficult to appreciated by transthoracic echo. Will need tee to better assess her aortic and mitral valves to guid further therapy.   Plan 1. Conintue iwth current meds 2 Rule out for an mi 3. TEE in am to evaluate aortic prosthetic valve and mitral valve. 4. Continue plavix 5. Further recs after tee   Electronic Signatures: Teodoro Spray (MD)  (Signed 04-Jun-14 21:03)  Authored: General Aspect/Present Illness, History and Physical Exam, Review of System, EKG , Allergies, Impression/Plan   Last Updated: 04-Jun-14 21:03 by Teodoro Spray (MD)

## 2014-07-26 LAB — URINE CULTURE

## 2014-07-27 NOTE — Consult Note (Signed)
General Aspect This is an 81 year old female with history of aortic stenosis status post #1 carpentier-Edwards aortic valve replacement 905 peripheral vascular disease with bilateral renal artery stenosis and right carotid endarterectomy in 1998, diabetes mellitus, hypertension, CAD with PCI and stent placement right coronary in January of 2011 as well as a non-ST elevation MI with critical stenosis of the ostium of the right coronary artery with drug eluding stent placement and treatment with FPN, and aspirin due to history of remote intolerance to Plavix.  Patient developed acute nausea, vomiting, diarrhea and presented with gastroenteritis with elevated blood sugars.  She has had mildly elevated cardiac enzymes but no chest pain, no acute ST changes.  Patient does have chronic shortness of breath which has been somewhat worsened by her acute illness.  Patient was actually moved out of the unit today with blood sugars now down in the 200s from the 700s.  Her nausea, vomiting, diarrhea is improved.  She did have an echocardiogram in December of 2012 which showed normal LV systolic function.  EF is 60%.  Moderate Biatrial enlargement, moderate left ventricular hypertrophy,  Moderate mitral and tricuspid insufficiency and normal functioning aortic prosthesis.   Physical Exam:   GEN WD, NAD    HEENT pink conjunctivae    RESP normal resp effort  clear BS    CARD Regular rate and rhythm  Murmur    Murmur Systolic    Systolic Murmur Out flow    EXTR negative edema    SKIN skin turgor poor    NEURO cranial nerves intact, motor/sensory function intact    PSYCH good insight   Review of Systems:   Subjective/Chief Complaint Nausea, vomiting, diarrhea.    General: Weight loss or gain    Skin: No Complaints    Respiratory: Short of breath    Cardiovascular: No Complaints    Gastrointestinal: Nausea  Diarrhea    Genitourinary: Burning or painful urination  Chronic    Vascular: No  Complaints    Musculoskeletal: No Complaints    Neurologic: No Complaints    Hematologic: No Complaints    Endocrine: Recent thirst due to dehydration    Psychiatric: No Complaints    Medications/Allergies Reviewed Medications/Allergies reviewed    Sulfa drugs: Swelling  Septra: Other  PCN: Other  Hydrocodone: Unknown  Tape: Rash  Vital Signs/Nurse's Notes: **Vital Signs.:   04-Mar-13 12:30   Vital Signs Type Routine   Temperature Temperature (F) 98.5   Celsius 36.9   Temperature Source oral   Pulse Pulse 74   Pulse source per Dinamap   Respirations Respirations 18   Systolic BP Systolic BP 737   Diastolic BP (mmHg) Diastolic BP (mmHg) 62   Mean BP 88   BP Source Dinamap   Pulse Ox % Pulse Ox % 97   Pulse Ox Activity Level  At rest   Oxygen Delivery Room Air/ 21 %     Impression 81 year old female with CAD, PVD, diabetes mellitus, hypertension, status post stent placement, Non-Q-wave MI in 12/ 2012, presenting with acute gastroenteritis with nausea, vomiting, diarrhea,Improved, with mild elevation in cardiac enzymes, thought to be demand ischemia and not non-Q coronary syndrome.    Plan 1.  No other cardiac intervention is suggested at this time.  Continue treatment of gastroenteritis  already in place.    Dr. Nehemiah Massed agrees with the above plan.   Electronic Signatures: Roderic Palau (NP)  (Signed 04-Mar-13 14:08)  Authored: General Aspect/Present Illness, History and Physical Exam, Review  of System, Allergies, Vital Signs/Nurse's Notes, Impression/Plan   Last Updated: 04-Mar-13 14:08 by Roderic Palau (NP)

## 2014-07-27 NOTE — Consult Note (Signed)
PATIENT NAME:  Mary Lambert, Mary Lambert MR#:  [id number] DATE OF BIRTH:  02-Jul-1925  DATE OF CONSULTATION:  03/31/2011  REFERRING PHYSICIAN:   CONSULTING PHYSICIAN:  Isaias Cowman, MD  PRIMARY CARE PHYSICIAN: Benita Stabile, MD  CHIEF COMPLAINT: Shortness of breath.   HISTORY OF PRESENT ILLNESS: The patient is an 81 year old female with known history of coronary artery disease and aortic valve replacement who is referred for evaluation for congestive heart failure and non-ST elevation myocardial infarction. The patient apparently has been in her usual state of health with chronic exertional dyspnea and pedal edema. Two days prior to admission the patient developed acute shortness of breath which woke her up from sleep. She apparently had an echocardiogram done yesterday at [facility] (results pending). The patient presented to [facility] Emergency Room where EKG was nondiagnostic. The patient ruled in for non-ST elevation myocardial infarction with a peak troponin of 9.4.   PAST MEDICAL HISTORY:  1. Coronary artery disease status post stent in right coronary artery, 04/2009.  2. Aortic valve replacement with Carpentier-Edwards pericardial valve, 12/2003.  3. Known peripheral vascular disease with bilateral renal artery stenosis and right carotid endarterectomy, in 1998. 4. Diabetes.  5. Hypertension.  6. Hyperlipidemia   ADMISSION MEDICATIONS:  1. Aspirin 81 mg daily.  2. Amlodipine 10 mg daily.  3. Furosemide 40 mg daily.  4. Toprol-XL 50 mg daily.  5. Simvastatin 20 mg at bedtime.  6. Lisinopril 10 mg daily.  7. Klor-Con 10 mEq daily.  8. Advair Diskus one puff twice a day. 9. Femara 2.5 mg daily.  10. Spiriva 18 mcg inhaled daily. 11. Allegra 180 mg daily.  12. Singulair 10 mg daily.  13. Levothyroxine 112 mcg daily.  14. Fosamax 70 mg weekly.  15. Temazepam 50 mg daily.  16. Lantus insulin 20 units every a.m.   SOCIAL HISTORY: The patient currently lives with her husband. She quit  tobacco abuse 30 years ago.   FAMILY HISTORY: Positive for coronary artery disease.   REVIEW OF SYSTEMS: CONSTITUTIONAL: No fever or chills. EYES: No blurry vision. EARS: No hearing loss. RESPIRATORY: Shortness of breath, as described above. CARDIOVASCULAR: No chest pain. GASTROINTESTINAL: No nausea, vomiting, diarrhea, or constipation. GU: No dysuria or hematuria. ENDOCRINE: No polyuria or polydipsia. NEUROLOGIC: The patient denies focal muscle weakness or numbness. PSYCHOLOGICAL: The patient denies depression or anxiety.   PHYSICAL EXAMINATION:   VITAL SIGNS: Blood pressure 129/68, pulse 105, respirations 20, temperature 97.8, and pulse oximetry 98%.   HEENT: Pupils are equal and reactive to light and accommodation.   NECK: Supple without thyromegaly.   LUNGS: Decreased breath sounds in both bases.   HEART: Normal jugular venous pressure. Normal point of maximal impulse. Regular rate and rhythm. Normal S1 and S2. Grade 1/6 systolic murmur.   ABDOMEN: Soft and nontender without hepatosplenomegaly.   EXTREMITIES: No cyanosis or clubbing. Trace pedal edema.   MUSCULOSKELETAL: Normal muscle tone.   NEUROLOGIC: The patient is alert and oriented x3. Motor and sensory are both grossly intact.   IMPRESSION: This is an 81 year old female with known coronary artery disease as well as aortic valve replacement who presents with congestive heart failure and diagnostic elevation of troponin consistent with non-ST elevation myocardial infarction.   RECOMMENDATIONS:  1. Agree with current therapy.  2. Continue heparin drip.  3. Proceed with cardiac catheterization with selective coronary arteriography in the a.m. The risks, benefits, and alternatives were explained and informed written consent obtained.  ____________________________ Isaias Cowman, MD ap:slb D: 03/31/2011 16:59:00  ET T: 03/31/2011 17:38:43 ET JOB#: [id number]  cc: Isaias Cowman, MD, <Dictator> Isaias Cowman  MD ELECTRONICALLY SIGNED 04/16/2011 10:04

## 2014-07-27 NOTE — H&P (Signed)
PATIENT NAME:  Mary Lambert, Mary Lambert MR#:  [id number] DATE OF BIRTH:  1925/12/24  DATE OF ADMISSION:  06/05/2011  PRIMARY CARE PHYSICIAN: Dr. Benita Stabile  CARDIOLOGIST: Dr. Nehemiah Massed    CHIEF COMPLAINT: Vomiting, nausea, diarrhea, unable to keep anything down, not able to even keep fluids down.   HISTORY OF PRESENT ILLNESS: An 81 year old female. She has multiple medical problems. She coronary artery disease with recent stent placement, diabetes, insulin-dependent, hypertension, hyperlipidemia, hypothyroidism, peripheral vascular disease with previous carotid endarterectomy, she has a bioprosthetic aortic valve replacement in the past, history of right breast cancer status post mastectomy, history of chronic diastolic failure, recurrent urinary tract infections, chronic obstructive pulmonary disease. Today she comes in because of nausea, vomiting and diarrhea going on since yesterday. She says she has a bladder infection and she was on Macrobid for about a week but the Macrobid did not help her much. She is still having some dysuria she says. Yesterday she started having vomiting. She is also having some diarrhea. She says she vomited multiple times yesterday and this morning. She is unable to tolerate p.o. She says even she cannot tolerate fluids or water. Nothing is staying in. She says the diarrhea is not totally watery, it is semiformed. No blood in stools. She denies any abdominal pain. She took her Lantus yesterday, 22 units in the morning, but she did not take her Lantus today. When she came into the Emergency Room she found to have a sugar of 717, bicarbonate 17 with a gap of 21. The patient was found to be in diabetic ketoacidosis. Her pH was 7.24. Urinalysis shows 2+ ketones. She has been started on insulin drip in the Emergency Room and got fluid bolus in the Emergency Room. She denies any fever. She was complaining of some shortness of breath also but no chest pain. Her shortness of breath must be most  likely secondary to diabetic ketoacidosis. She is very dry at this time. She is asking for ice chips. She is being admitted for diabetic ketoacidosis with insulin-dependent diabetes.   REVIEW OF SYSTEMS: No fever. Positive for weakness. No acute change in vision. No headache. No dizziness. No cough. No dyspnea but she has history of chronic obstructive pulmonary disease. No chest pain. No palpitations or syncope. Presents with nausea, vomiting, diarrhea. No abdominal pain. Complaining of dysuria also. She has thyroid problems. She says that she has sometimes epistaxis and rectal bleed but she is not bleeding now. She has joint pains. She has pains in bilateral knee. Denies any focal numbness or weakness. No anxiety.   PAST MEDICAL HISTORY:  1. Coronary artery disease. Her last catheterization was done in December 2012 when she had a myocardial infarction , she had a PCI of ostial RCA done at that time.  2. Diabetes, insulin dependent. 3. Hypertension. 4. Hypothyroidism. 5. Hyperlipidemia. 6. Peripheral vascular disease with renal artery stenosis and previous carotid endarterectomy on the right side.  7. Valvular heart disease with aortic stenosis status post bioprosthetic aortic valve replacement in 2005.  8. History of right breast cancer status post mastectomy in 2011.  9. She has chronic diastolic failure.  10. History of congestive heart failure. We don't have any recent echo report. The last echo was done in January 2011 which showed ejection fraction of 65% so most likely chronic diastolic failure.  11. Osteoarthritis. 12. Osteoporosis. 13. Chronic obstructive pulmonary disease, not on oxygen. 14. Recurrent urinary tract infections. 15. Gastroesophageal reflux disease postpolypectomy and GI bleed.  16.  She was also admitted in December 2012 because of non-ST elevation myocardial infarction.   PAST SURGICAL HISTORY:  1. Right mastectomy.  2. Right carotid  endarterectomy. 3. Hysterectomy.   ALLERGIES TO MEDICATIONS: Penicillin, Plavix, sulfa, Septra and Cipro.   HOME MEDICATIONS: Which I have reviewed with the patient: 1. Advair Diskus 500/50 b.i.d.  2. Amlodipine 10 mg daily. 3. Benzonatate 100 mg 3 times a day as needed.  4. Effient 10 mg daily.  5. Fosamax  70 mg once a week. 6. Lasix 40 mg daily. 7. Insulin lispro 5 units t.i.d. with meals. 8. Klor-Con 10 mEq twice a day. 9. Lantus 22 units daily. 10. Levothyroxine 112 mcg daily. 11. Lisinopril 5 mg daily.  12. Metoprolol 50 mg daily.  13. Montelukast 10 mg daily.  14. Nexium 40 mg daily.  15. Oxybutynin 5 mg daily.  16. Simvastatin 20 mg at bedtime.  17. Spiriva HandiHaler daily.  18. Flomax 0.4 mg daily. 19. Temazepam 15 mg at bedtime as needed. 20. Vitamin E 800 units daily.   SOCIAL HISTORY: According to previous records she lives in [place]. Quite tobacco 40 years ago. No alcohol or drug use. She lives with her husband.   FAMILY HISTORY: Positive for diabetes, hypertension and heart disease.   PHYSICAL EXAMINATION:  VITAL SIGNS: Her vitals when she presented to the Emergency Room: Temperature 97.3, heart rate 88, respiratory rate 18, blood pressure 153/87, saturating 98% on room air.   GENERAL: This is an elderly Caucasian female, thin built. She is dehydrated at this time asking for some ice chips.   HEENT: Bilateral pupils are equal. Extraocular muscles are intact. No scleral icterus. No conjunctivitis. Oral mucosa dry, some pallor.   NECK: No thyroid tenderness, enlargement or nodules. Neck is supple, no masses, nontender. No adenopathy. No JVD. No carotid bruit.   CHEST: Bilateral breath sounds are clear. No wheeze. Normal effort. No respiratory distress.   HEART: Heart sounds are regular. There is a murmur. Peripheral pulses 1+. No lower extremity edema.   ABDOMEN: Soft, nontender. Normal bowel sounds. No hepatosplenomegaly. No bruits. No masses.  RECTAL:  Deferred.   NEUROLOGICAL: She is awake, alert, oriented to time, place and person. Cranial nerves are intact. Moving all extremities against gravity. No clubbing.   SKIN: No rash. No lesions. Poor skin turgor.   LABORATORY, DIAGNOSTIC AND RADIOLOGICAL DATA: White count 9.4, hemoglobin 10.2, platelet count 183,000 last hemoglobin was 9.6 on 12/28. BMP: Sodium 136, potassium 4.8, BUN 23, creatinine 1.14, glucose 717 with a bicarbonate of 17 and a gap of 21. Troponin 0.08. Urinalysis shows 2+ ketones, glucose greater than 500, negative nitrite, trace leukocyte esterase, WBC none, pH of 7.24. Abdominal film: Nonspecific exam. Gas pattern is nonspecific. No free air. No acute cardiopulmonary disease. Prior aortic valve replacement.    IMPRESSION:  1. Uncontrolled diabetes/Diabetic ketoacidosis. 3. Intractable vomiting, diarrhea, suspect gastroenteritis. 4. Dehydration. 5. Mildly elevated troponins.  6. Coronary artery disease with recent stent placement.  7. Hypertension.  8. Hyperlipidemia.  9. Hypothyroidism.  10. Peripheral vascular disease. 11. History of breast cancer.  12. Chronic diastolic failure, compensated. 13. Recent urinary tract infections. 14. Chronic obstructive pulmonary disease, stable. 15. History of bioprosthetic aortic valve replacement.  PLAN: An 81 year old female with multiple medical problems of coronary artery disease, bioprosthetic aortic valve, peripheral vascular disease, diabetes insulin dependent, hypertension, hyperlipidemia, hypothyroidism, and chronic obstructive pulmonary disease. She comes in with vomiting and diarrhea since one day, not able to tolerate p.o. Found to  have sugar of 700. She is in diabetic ketoacidosis with ketonuria, gap of 21, bicarbonate 17 and pH of 7.24. She has already started on insulin drip. I am going to give her fluids but cautiously considering that she has history of congestive heart failure. I am going to do serial BMPs on her. Hold  her Lasix at this time. She has mildly elevated troponins which could be demand ischemia. Will trend it. Will get a cardiology consult on her. Will continue her Effient, statin, beta blocker, ACE inhibitor. She had a recent cardiac catheterization with a stent of RCA. Will also check a hemoglobin A1c on her. Her last hemoglobin A1c in December was 8.7. Her diabetic ketoacidosis could have been precipitated by her viral gastroenteritis. Will also send some stool studies on her. Also send a urine culture. Her urinalysis does not show any pyuria. Most likely urinary tract infection has already resolved. Patient will be admitted to Intensive Care Unit for insulin drip. Plan was discussed with the patient and the family.  TIME SPENT WITH ADMISSION AND COORDINATION: Around 50 minutes.   ____________________________ Mena Pauls, MD ag:cms D: 06/05/2011 18:57:35 ET T: 06/06/2011 05:57:58 ET JOB#: [id number]  cc: Mena Pauls, MD, <Dictator> Leona Carry. Hall Busing, MD Mena Pauls MD ELECTRONICALLY SIGNED 06/24/2011 11:48

## 2014-07-27 NOTE — Discharge Summary (Signed)
PATIENT NAME:  Mary Lambert, GUNDERMAN MR#:  [id number] DATE OF BIRTH:  1925/05/13  DATE OF ADMISSION:  03/31/2011 DATE OF DISCHARGE:  04/03/2011  DISCHARGE DIAGNOSES:   1. Non-ST-elevation myocardial infarction.   2. Hypertension.   3. Diabetes.  4. History of hypertension.  5. History of heart failure that is diastolic.  6. History of chronic obstructive pulmonary disease.  7. History of hypothyroidism.  8. Patient also has history of aortic stenosis, status post valve replacement.   9. Peripheral vascular disease.   10. Hyperlipidemia.   11. History of coronary artery disease.    PRIMARY DOCTOR:  Dr. Hall Busing   CARDIOLOGIST:  Dr. Nehemiah Massed.    PROCEDURE:  Angiogram.    CONSULTATIONS: Cardiology, Dr. Nehemiah Massed.   LABORATORY DATA: WBC 5.9, hemoglobin 10.3, hematocrit 31.7, platelets 175,000.  Initial troponin 7.44 with second one 9. 4, third one 19.30.  The patient's CPK-MB on March 31, 2011, at 9 p.m. 9.6 and CK total 123. TSH 1.54.  VLDL showed 51 and cholesterol 152, triglycerides 135, HDL 76.  Hemoglobin A1c 8.7. Magnesium is 2. Patient's CBC and BMP stayed stable. Cardiac catheterization is done on April 01, 2011 which showed worsening ostial right coronary artery stenosis. The patient had a stent in the right ostial right coronary artery and also found to have a 50% stenosis in the distal left main. Ostial left main has 50% stenosis.  Distal left main had 20% stenosis and proximal LAD 50% stenosis. The patient also had 2nd lesion showed 40% stenosis in the mid LAD. She had 40% stenosis.   HOSPITAL COURSE:  An 81 year old female with history of hypertension, diabetes, history of peripheral vascular disease, carotid endarterectomy, history of breast cancer, history of aortic stenosis and also diastolic heart failure, chronic obstructive pulmonary disease, hypertension, hypothyroidism, hyperlipidemia, gastroesophageal reflux disease, came in because of shortness of breath and elevated  troponins.  The patient was admitted to hospitalist service, started on aspirin, beta blocker that she was getting along with nitroglycerin. Cardiology consult Dr. Nehemiah Massed obtained because of elevated troponins.   The patient is taken to cardiac catheterization, found to have diffuse coronary artery disease, and critical stenosis in the ostium of right coronary artery. She had a stent placed. She was started on Effient, and patient has intolerance to Plavix so we started her on Effient. She needs to continue Effient 10 mg daily. The patient also has EKG which showed P wave inversions in aVR, no ST elevations. Chest x-ray did not show any infiltrate. The patient did receive some IV hydration after the catheterization to prevent contrast-induced nephropathy, and she did very well after the cardiac catheterization and stent placement. Did not have any further episodes of chest pain and/or trouble breathing and has ambulated well so we are going to discharge her today and continue her home medications and also add Effient.  For diabetes she is on Lantus and NovoLog.  Hemoglobin A1c is slightly elevated.  Dr. Hall Busing to adjust her medications accordingly and also the patient has history of congestive heart failure in the setting of heart disease. The patient also had history of hypothyroidism and hyperlipidemia. The patient's condition is stable. Discussed the plan with patient's husband, and patient will be ambulating; and we will check the oxygen saturation while she ambulates to see if she qualifies for home oxygen as she said she needs sometimes oxygen and she feels like sometimes she needs oxygen.    ____________________________ Epifanio Lesches, MD sk:vtd D: 04/03/2011 11:11:07 ET T: 04/06/2011  13:45:18 ET JOB#: [id number]  cc: Epifanio Lesches, MD, <Dictator> Epifanio Lesches MD ELECTRONICALLY SIGNED 04/17/2011 16:49

## 2014-07-27 NOTE — Discharge Summary (Signed)
PATIENT NAME:  Mary, Lambert MR#:  [id number] DATE OF BIRTH:  Mar 19, 1926  DATE OF ADMISSION:  06/05/2011 DATE OF DISCHARGE:  06/08/2011  DISCHARGE DIAGNOSES:  1. Diabetic ketoacidosis, resolved.  2. Acute gastroenteritis, resolved.  3. Minimal subendocardial myocardial infarction with coronary artery disease and recent drug-eluting stent placement.  4. Chronic obstructive pulmonary disease, stable.  5. Chronic diastolic failure, stable.  6. Hypertension.  7. Hyperlipidemia.  8. Peripheral vascular disease.  9. History of bioprosthetic aortic valve.  10. Hypothyroidism.  11. History of right breast cancer status post mastectomy on Femara. 12. Osteoarthritis.  13. Osteoporosis.   CONSULTATIONS:  Cardiology, Dr. Nehemiah Massed.  HOSPITAL COURSE:  81 year old female who has multiple medical problems of diabetes, coronary artery disease, hypertension, hyperlipidemia, peripheral vascular disease, and bioprosthetic aortic valve. She had a recent drug-eluting stent. She had a myocardial infarction in December 2012. She presented with nausea, vomiting, and diarrhea, unable to keep anything down. When she came in her sugars were found to be greater than 700 with a bicarbonate of 17 and anion  gap of 21.  Her urinalysis showed 2+ ketones. She was admitted as diabetic ketoacidosis in the setting of acute gastroenteritis. She was started on insulin drip. Her hemoglobin A1c was found to be 9.6. She was later transitioned off insulin drip back to Lantus. She takes Lantus and lispro at home. Her Lantus has been increased to 35 units here. She said that might be too high so I am going to discharge her on 30 units daily of Lantus, and her Lantus can be further titrated as outpatient. We will continue her lispro 5 units t.i.d. with meals.  Her gastroenteritis has resolved.  She is tolerating her diet right now. An abdominal film was done at the time of admission which showed nonspecific gas pattern, no free air, and  no acute cardiopulmonary disease. She also had an ultrasound of the abdomen done which showed gallbladder wall thickening with an area that may represent fluid in the gallbladder wall. Questionable acalculous cholecystitis. Murphy's sign is negative. No evidence of pericholecystic fluid. She had no abdominal pain or tenderness and her nausea and vomiting have resolved, so most likely this is not cholecystitis. She also had some troponin elevation during the hospital stay; the troponin jumped up to 4.24. When she came in it was 0.08. Cardiology was consulted. The patient is already on aspirin, Effient, statin, beta blocker, and ACE inhibitor.  She had a recent catheterization in December 2012 with a stent placement in ostial RCA. He advised medical management. No further cardiac intervention is needed at this time. The patient was given aspirin but the patient would like to not take aspirin because of nosebleeds and rectal bleed. We will keep her off aspirin and continue Effient. At discharge her creatinine is stable at 0.61. Glucose is 118. Her troponin has come down to 2.  She has no chest pain. She is anemic with a hemoglobin ranging from 8.9 to 9.3. We will repeat hemoglobin before discharge to make sure it is stable. She is on iron pills also. Home Health PT and R.N. have been arranged for the patient.   DISCHARGE MEDICATIONS:  1. Advair Diskus 500/50 b.i.d.  2. Spiriva HandiHaler daily. 3. Amlodipine 10 mg daily.  4. Lasix 40 mg daily.  5. Levothyroxine 112 mcg daily.  6. Simvastatin 20 mg daily.  7. Fosamax 70 mg once a week.  8. Temazepam 15 mg once a day.  9. Singulair 10 mg at bedtime.  10. Nexium 40 mg daily.  11. Effient 10 mg daily. 12. Lisinopril 5 mg daily. 13. Nitroglycerin p.r.n.  14. Vitamin E 800 units daily. 15. Benzonatate 100 mg 3 times a day.  16. Klor-Con 10 mEq twice a day. 17. Oxybutynin 5 mg 3 times a day.  18. Metoprolol 50 mg daily. 19. Lisinopril 5 mg daily.   20. Flomax 0.4 mg daily.  21. Femara 2.5 mg daily.  22. Insulin lispro 5 units t.i.d. with meals. 23. Change Lantus insulin to 30 units subcutaneous once daily.  24. Ferrous sulfate 325 mg p.o. b.i.d. with meals. 25. Cepacol lozenges, one lozenge q. 4 to 6 hours as needed for sore throat.   CONDITION AT DISCHARGE: Fair. She is tolerating diet. T-max 98.2, heart rate 74, blood pressure 154/74, saturating 99% on room air. Chest is clear. Heart sounds are regular. Abdomen soft, nontender.   DIET: Advised a low sodium, 1800-calorie, low cholesterol diet.   FOLLOWUP:  1. Follow up with Dr. Hall Busing in one week. Follow up hemoglobin in one week.  2. Follow up with Dr. Nehemiah Massed in 1 to 2 weeks.  3. Home Health PT and RN have been arranged for the patient.    TIME SPENT: 50 minutes.  ____________________________ Mena Pauls, MD ag:bjt D: 06/08/2011 11:00:36 ET T: 06/08/2011 17:32:35 ET JOB#: [id number]  cc: Mena Pauls, MD, <Dictator> Corey Skains, MD Leona Carry Hall Busing, MD Mena Pauls MD ELECTRONICALLY SIGNED 06/09/2011 16:50

## 2014-07-27 NOTE — Discharge Summary (Signed)
PATIENT NAME:  Mary Lambert, Mary Lambert MR#:  [id number] DATE OF BIRTH:  1925-05-05  DATE OF ADMISSION:  06/05/2011 DATE OF DISCHARGE:  06/08/2011  ADDENDUM: Her hemoglobin at discharge is stable at 8.9. Continue iron supplementation at home and follow up hemoglobin as an outpatient.  a____________________________ Mena Pauls, MD ag:slb D: 06/08/2011 11:20:00 ET T: 06/08/2011 17:24:34 ET JOB#: [id number]  cc: Mena Pauls, MD, <Dictator> Leona Carry. Hall Busing, MD Mena Pauls MD ELECTRONICALLY SIGNED 06/09/2011 16:48

## 2014-07-27 NOTE — H&P (Signed)
PATIENT NAME:  Mary Lambert, Mary Lambert MR#:  [id number] DATE OF BIRTH:  08-31-1925  DATE OF ADMISSION:  03/31/2011  REFERRING PHYSICIAN: Dr. Beather Arbour.  PRIMARY CARE PHYSICIAN: Dr. Hall Busing. PRIMARY CARDIOLOGIST: Dr. Nehemiah Massed.   PRESENTING COMPLAINT: Shortness of breath.   HISTORY OF PRESENT ILLNESS: Mary Lambert is an 81 year old woman with history of aortic stenosis, status post replacement, peripheral vascular disease, diabetes, breast cancer, congestive heart failure, chronic obstructive pulmonary disease, hypothyroidism, who presents today with reports of developing shortness of breath that woke her up from her sleep this morning and also two days ago with similar episode. She denies any chest pain. Endorses palpitations, but no syncope. She was recently seen by her PA, Roderic Palau with Dr. Nehemiah Massed for follow up and had a echocardiogram performed yesterday. There were changes made in her medications with increasing the dose of her lisinopril and decreasing the dose of Norvasc as the patient was complaining of increasing ankle edema. She reports that the ankle swelling has not improved but she also has not been compliant with taking her Lasix daily. She reports that it makes her "pee too frequently". Reports that currently her shortness of breath has improved but still persistent. She last had a catheterization in January 2011 that required a RCA stents.   PAST MEDICAL HISTORY:  1. Valvular heart disease with aortic stenosis, status post aortic valve replacement in 2005.  2. Peripheral vascular disease with renal artery stenosis and status post carotid endarterectomy.  3. Diabetes, insulin requiring.  4. Right breast cancer status post mastectomy in June 2011.  5. Congestive heart failure.  6. Recurrent urinary tract infections.  7. Chronic obstructive pulmonary disease/asthma.  8. Osteoarthritis.  9. Osteoporosis.  10. Hypertension.  11. Hypothyroidism.  12. Hyperlipidemia.  13. Gastroesophageal  reflux disease.  14. Post polypectomy gastrointestinal bleed.  15. Coronary artery disease status post myocardial infarction. Last catheterization on 04/24/2009 with proximal left main 25%, proximal LAD 40% and 50%, first diagonal 30%, ostial RCA 70%, proximal RCA 85% and 50%, proximal circumflex 60%, mid circumflex 75% and 80%.   PAST SURGICAL HISTORY: As above and also:  1. Hysterectomy. 2. Left cataract surgery.   ALLERGIES: Penicillin, Plavix, sulfa, Septra.   MEDICATIONS:  1. Toprol-XL 50 mg daily.  2. Singulair 10 mg daily.  3. Spiriva daily.  4. Zocor 20 mg daily.  5. Nexium 40 mg daily.  6. Synthroid 112 mcg daily.  7. Lantus 23 units at bedtime.  8. Apidra 5 units before meals.  9. Neurontin 400 mg t.i.d.  10. Lasix 40 mg daily, which she has not been compliant.  11. Femora 2.5 mg daily.  12. Fosamax 70 mg weekly.  13. Norvasc 5 mg daily.  14. Aspirin 81 mg daily.  15. Allegra 180 mg daily.  16. Advair 500/50, one puff b.i.d.  17. Lisinopril 10 mg daily.  18. Temazepam 15 mg daily.  19. Flonase one spray both nares b.i.d.  20. Macrobid 100 mg b.i.d.   SOCIAL HISTORY: She lives in [place]. Quit tobacco 40 years ago. No alcohol or drug use. She lives with her husband.   FAMILY HISTORY:  Heart disease, diabetes, hypertension.   REVIEW OF SYSTEMS: CONSTITUTIONAL: No fevers or chills. She endorses some nausea.  EYES: No glaucoma. She has cataracts. ENT: No ear pain, epistaxis, or discharge. RESPIRATORY: She reports cough that has now subsided, dry. No hemoptysis. She reports shortness of breath as per history of present illness. CARDIOVASCULAR: No chest pain. Endorses ankle edema, palpitations and  presyncope, but no syncope. GASTROINTESTINAL: No vomiting, diarrhea, abdominal pain, hematemesis, or melena. GU: No dysuria or hematuria. ENDOCRINE: She has history of diabetes and reports polyuria, but that is related to the Lasix. HEMATOLOGIC: No easy bleeding. SKIN: No ulcers.  MUSCULOSKELETAL: She has knee pain bilaterally. NEUROLOGIC: No one-sided weakness or numbness. PSYCH: Denies any depression or suicidal ideation.   PHYSICAL EXAMINATION:  VITAL SIGNS: Temperature of 98, pulse 110, respiratory rate 18, blood pressure 151/76, sating at 96% on room air.   GENERAL: Lying in bed in no apparent distress.   HEENT: Normocephalic, atraumatic. Pupils equal, symmetric. Anicteric. Nares without discharge. Moist mucous membrane.   NECK: Soft and supple. No adenopathy or JVP.   CARDIOVASCULAR: Non-tachy. No murmurs, rubs, or gallops.   LUNGS: Clear to auscultation bilaterally. No use of accessory muscles or increased respiratory effort.   ABDOMEN: Soft, nontender, positive bowel sounds. No mass appreciated.   EXTREMITIES: Trace to 1+ pitting edema of the ankle. Dorsal pedis pulses intact.   MUSCULOSKELETAL: No joint effusion.   SKIN: No ulcers.   NEUROLOGIC: No dysarthria or aphasia. Symmetrical strength. No focal deficit.   PSYCH: She is alert and oriented. The patient is cooperative.   PERTINENT LABS AND STUDIES: Glucose 495, BUN 23, creatinine 0.74, sodium 138, potassium 4.4, chloride 102, carbon dioxide 19, calcium 9.4. LFTs within normal limits. WBC 5.9, hemoglobin 10.3, hematocrit 31.7, platelets 175, MCV 86. Troponin 7.44. Urinalysis with specific gravity of 1.018, negative blood, pH 5. EKG with sinus rate of 107, T wave inversion in aVR. No ST elevation or depression. Chest x-ray without infiltrate. There is some pulmonary congestion   ASSESSMENT AND PLAN: Mary Lambert is an 81 year old woman with history of diabetes, aortic stenosis, status post replacement, peripheral vascular disease, congestive heart failure, chronic obstructive pulmonary disease, coronary artery disease status post stent, who presents with complaints of shortness of breath/ paroxysmal nocturnal dyspnea.  1. Shortness of breath concerning for anginal equivalents with elevated troponin and  possible non-ST elevation myocardial infarction. Will admit the patient to telemetry. Cycle cardiac enzymes. Will restart aspirin, statin and beta blocker. Will continue with nitroglycerin patch and use nitroglycerin sublingual as needed. Continue with oxygen and morphine as needed. Her echo was done apparently yesterday. Will obtain cardiology consultation for recommendations on additional diagnostics. Send TSH, fasting lipid panel and A1c  2. Diabetes, uncontrolled, presenting with hyperglycemia. Resume her Lantus and NovoLog. Start the patient on sliding scale insulin as above. Send A1c.  3. Hypertension. The patient had recent decrease in her Norvasc due to complaints of ankle edema. We will continue Norvasc, lisinopril and Toprol-XL.  4. History of congestive heart failure, likely in the setting of valvular disease. Will await results of echocardiogram that was performed yesterday at [facility]. We will resume her Lasix regimen. Continue to follow ins and outs and daily weights. Her shortness of breath may have some component of congestive heart failure.  5. Chronic obstructive pulmonary disease. Restart her Spiriva and oxygen and SVNs as needed.  6. Hypothyroidism. Restart Synthroid.  7. Hyperlipidemia. Restart her Zocor.  8. Prophylaxis with Nexium, heparin drip and aspirin.   TIME SPENT: Approximately 45 minutes were spent on patient care.    ____________________________ Rita Ohara, MD ap:ap D: 03/31/2011 06:12:07 ET         T: 03/31/2011 09:23:58 ET              JOB#: [id number] cc: Mckynleigh Mussell, MD, <Dictator> Leona Carry. Hall Busing, MD Rita Ohara MD  ELECTRONICALLY SIGNED 04/23/2011 2:21

## 2014-07-30 IMAGING — US US EXTREM LOW VENOUS*L*
1 series · 14 of 24 positions shown · non-contrast
Comparison: none

REASON FOR EXAM: pedal edema, pain, currently with breast CA
COMMENTS:

PROCEDURE:     US  - US DOPPLER LOW EXTR LEFT  - May 10, 2012  [DATE]
RESULT:     Comparison: None

[Series 1: us extrem low venous*left* · 0.07mm/px · 14 of 32 slices shown]
[im 1/32]
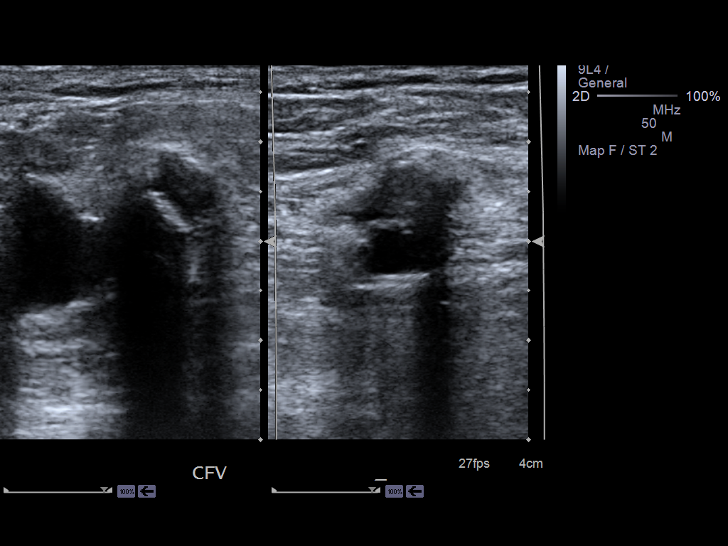
[im 3/32]
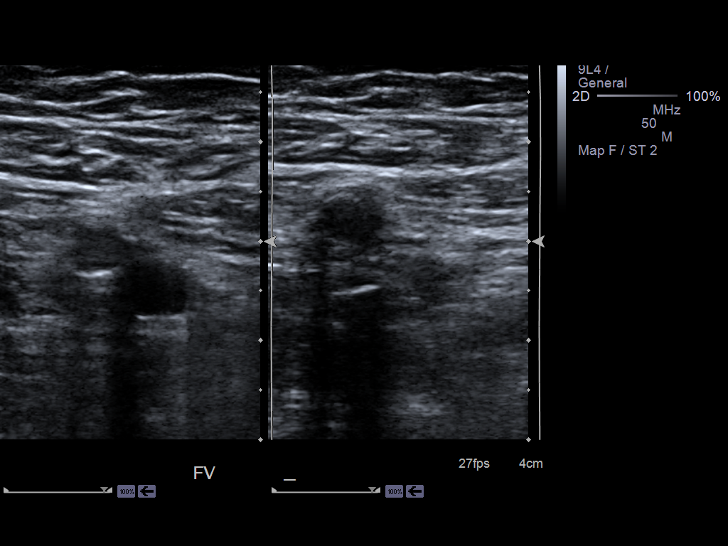
[im 6/32]
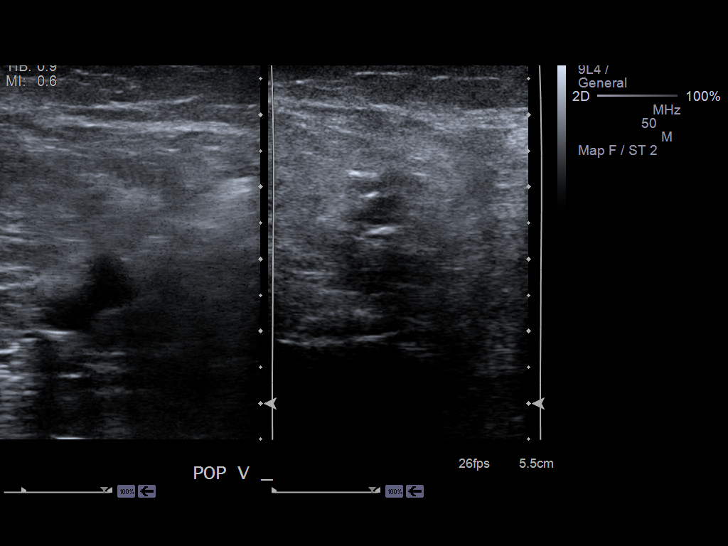
[im 9/32]
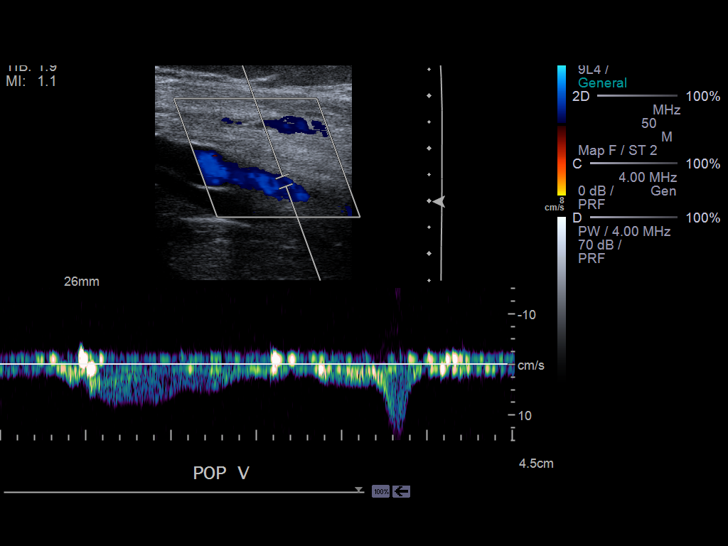
[im 10/32]
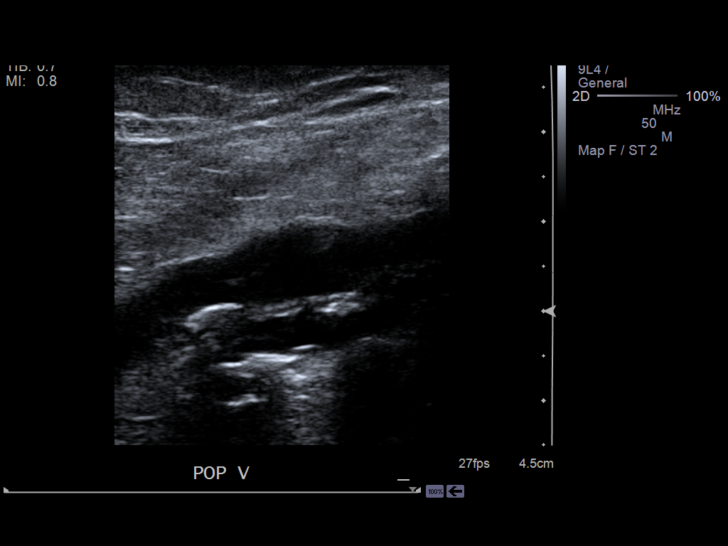
[im 13/32]
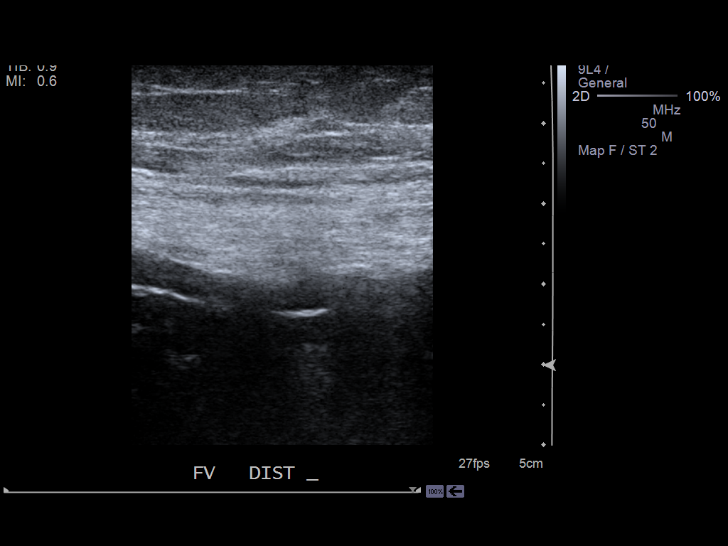
[im 15/32]
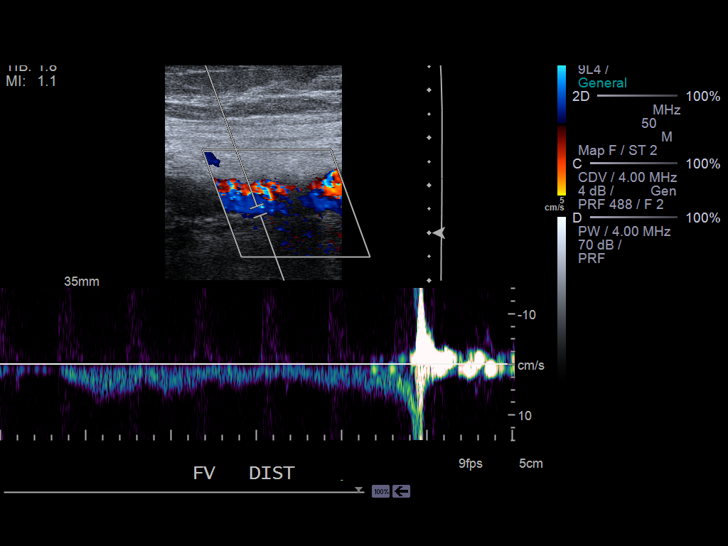
[im 17/32]
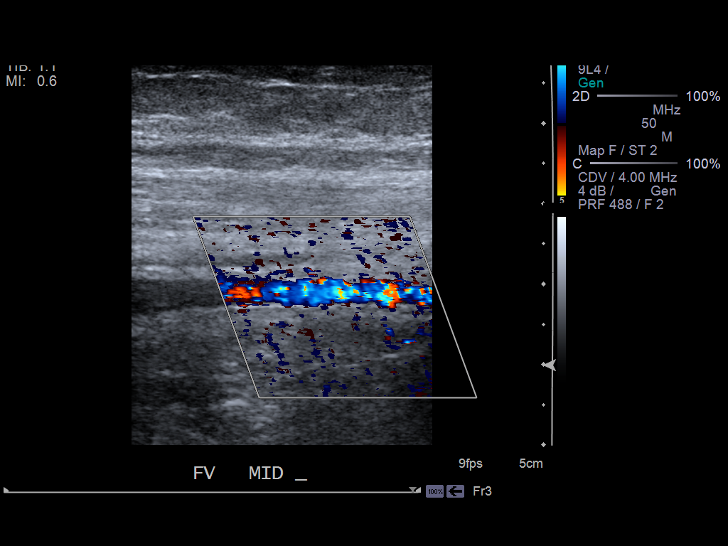
[im 19/32]
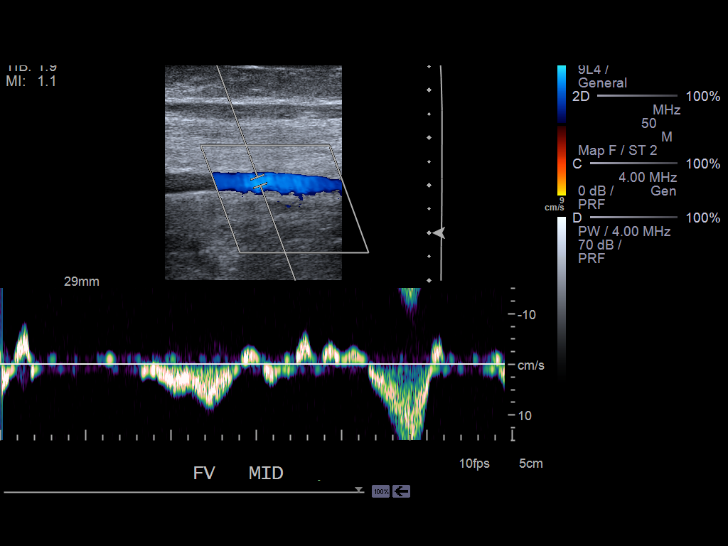
[im 22/32]
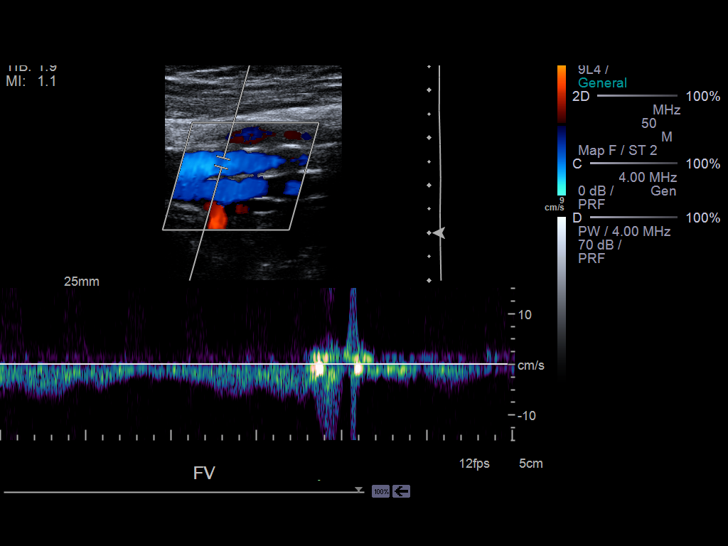
[im 25/32]
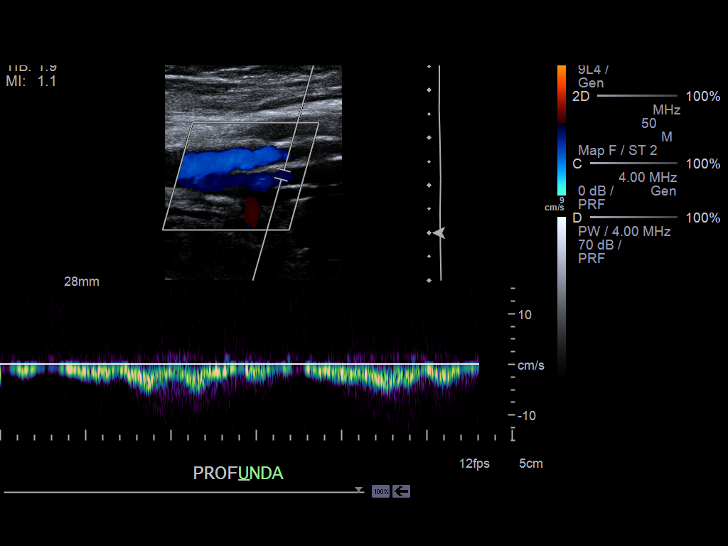
[im 26/32]
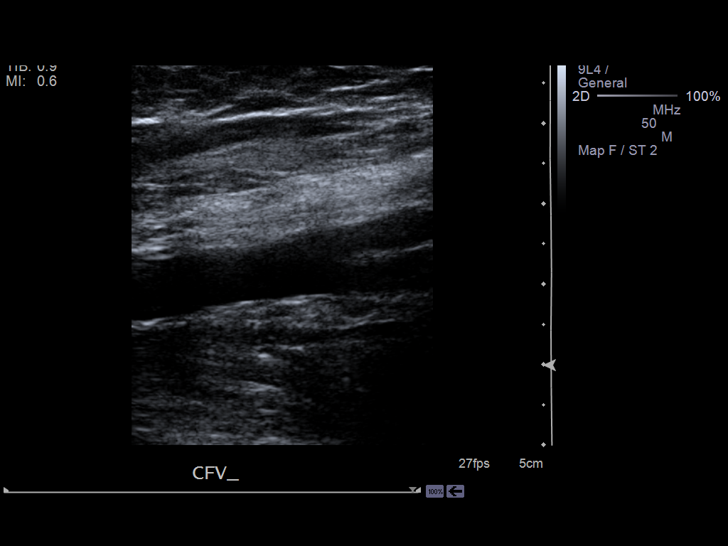
[im 29/32]
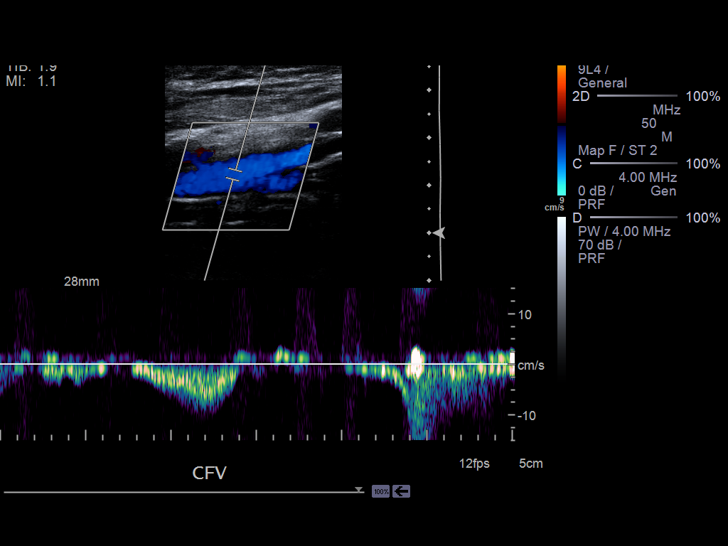
[im 32/32]
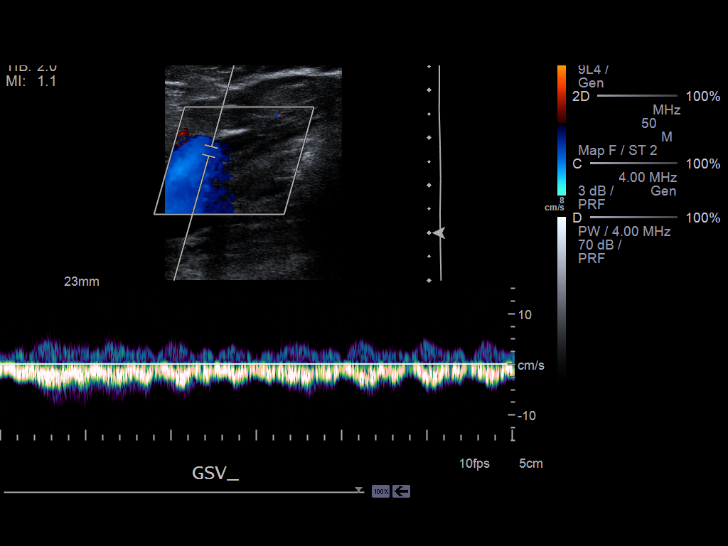

[14 of 24 positions shown; findings below may reference images not displayed]

FINDINGS: Multiple longitudinal and transverse gray-scale as well as color
and spectral Doppler images of the left lower extremity veins were obtained
from the common femoral veins through the popliteal veins.

The left common femoral, greater saphenous, femoral, popliteal veins, and
venous trifurcation are patent, demonstrating normal color-flow and
compressibility. No intraluminal thrombus is identified.There is normal
respiratory variation and augmentation demonstrated at all vein levels.
IMPRESSION: No evidence of DVT in the left lower extremity.

[REDACTED]

## 2014-07-30 NOTE — H&P (Signed)
PATIENT NAME:  Mary Lambert, Mary Lambert MR#:  [id number] DATE OF BIRTH:  07-Dec-1925  DATE OF ADMISSION:  04/03/2014  REFERRING PHYSICIAN: Wells Guiles L. Lord, MD  PRIMARY CARE PHYSICIAN: Leona Carry. Hall Busing, MD  CARDIOLOGIST: Corey Skains, MD, [facility].  CHIEF COMPLAINT: Right-sided weakness.   HISTORY OF PRESENT ILLNESS: An 81 year old Caucasian female with a history of hypertension, essential; gastroesophageal reflux disease without esophagitis; COPD, non-oxygen-dependent; type 2 diabetes, insulin-requiring; and diastolic congestive heart failure status post bioprosthetic aortic valve presenting with right-sided weakness. History obtained from the patient as well as husband, who is sitting at bedside. The patient was eating dinner, had acute onset of right hand weakness as well as slurred speech. They called EMS; upon arrival, noted blood glucose of 79 and still symptomatic. In the Emergency Department, she was hypoglycemic, blood glucose down to 47. Her symptoms have resolved in the Emergency Department; however, no one is quite sure whether this is before or after receiving dextrose. She also complains of increasing lower extremity edema for the last 1 to 2 weeks with orthopnea, dyspnea on exertion, and denies any chest pain or cough.  REVIEW OF SYSTEMS:  CONSTITUTIONAL: Denies fevers, chills, fatigue.  EYES: Denies blurred vision, double vision, or eye pain.  EARS, NOSE, THROAT: Denies tinnitus, ear pain, hearing loss.  RESPIRATORY: Denies cough, wheeze. Positive shortness of breath as described above.  CARDIOVASCULAR: Denies any chest pain. Positive for orthopnea as well as edema. Denies palpitations. GASTROINTESTINAL: Denies nausea, vomiting, diarrhea, abdominal pain.  GENITOURINARY: Denies dysuria, hematuria.  ENDOCRINE: Denies nocturia or thyroid problems. HEMATOLOGY AND LYMPHATIC: Denies easy bruising and bleeding.  SKIN: Denies rashes or lesions.  MUSCULOSKELETAL: Denies pain in neck,  back, shoulder, knees, hips, or arthritic symptoms.  NEUROLOGIC: Positive for right hand weakness and slurred speech as described above.  PSYCHIATRIC: Denies any anxiety or depressive symptoms.  Otherwise, full review of systems performed by me is negative.   PAST MEDICAL HISTORY: Essential hypertension; gastroesophageal reflux disease without esophagitis; COPD, non-oxygen-dependent; type 2 diabetes requiring insulin; hypothyroidism; coronary artery disease status post PCI and stent placement x 3; bioprosthetic aortic valve; as well as hyperlipidemia, unspecified; and congestive heart failure, diastolic.   SOCIAL HISTORY: Remote tobacco use. Denies any alcohol or drug use.   FAMILY HISTORY: Positive for diabetes, hypertension, coronary artery disease.   ALLERGIES: HYDROCODONE, PENICILLIN, SEPTRA, SULFA DRUGS, AS WELL AS TAPE.   HOME MEDICATIONS: Include aspirin 81 mg p.o. q. daily; meloxicam 7.5 mg p.o. q. daily; lisinopril 5 mg p.o. q. daily; Flomax 0.4 mg p.o. q. daily; Nitrostat 0.4 mg sublingual every 5 minutes as needed for chest pain; Lantus 20 units at bedtime; lispro 6 units 3 times daily; temazepam 15 mg p.o. at bedtime; metoprolol succinate 50 mg p.o. q. daily; alendronate 70 mg p.o. q. weekly on Sunday; Spiriva 18 mcg daily; Symbicort 160/4.5 mcg inhalation, 2 puffs b.i.d.; amlodipine 10 mg p.o. q. daily; Lasix 40 mg p.o. b.i.d.; montelukast 10 mg p.o. q. daily; Nexium 40 mg p.o. q. daily; levothyroxine 112 mcg p.o. q. daily; oxybutynin 5 mg p.o. b.i.d.   PHYSICAL EXAMINATION:  VITAL SIGNS: Temperature 98.5, heart rate of 78, respirations 20, blood pressure 102/44, saturating 95% on room air. Weight 64.9 kg, BMI 25.3. GENERAL: Weak-appearing Caucasian female, currently in no acute distress.  HEAD: Normocephalic, atraumatic.  EYES: Pupils equal, round, and reactive to light. Extraocular muscles intact. No scleral icterus.  MOUTH: Moist mucosal membrane. Dentition intact. No abscess  noted. EAR, NOSE, THROAT: Clear without  exudates. No external lesions.  NECK: Supple. No thyromegaly. No nodules. No JVD.  PULMONARY: Diminished breath sounds at the bases with bibasilar crackles. No use of accessory muscles.  CHEST: Nontender to palpation.  CARDIOVASCULAR: S1, S2. Regular rate, rhythm. There is a 3/6 systolic ejection murmur best heard at the left upper sternal border, 2+ edema to the knees bilaterally. Pedal pulses 2+ bilaterally.  GASTROINTESTINAL: Soft, nontender, nondistended. No masses. Positive bowel sounds. No hepatosplenomegaly.  MUSCULOSKELETAL: No swelling, clubbing. Edema as described above. Range of motion full in all extremities.  NEUROLOGIC: Cranial nerves II through XII intact. No gross focal neurological deficits. Sensation intact. Reflexes intact.  SKIN: No ulceration, lesions, rashes, or cyanosis. Skin warm, dry. Turgor intact.  PSYCHIATRIC: Mood and affect within normal limits. The patient is awake, alert, oriented x 3. Insight and judgment intact.   LABORATORY DATA: CT, head, performed which reveals no acute intracranial process. Sodium of 148; potassium of 2.6; chloride of 121; bicarbonate of 18; anion gap of 9; BUN 15; creatinine 0.53; glucose of 47 on arrival, currently 294. Total protein 4.4, albumin 2.1. Troponin 0.15. WBC 6.6, hemoglobin 11.9, platelets of 137,000. Urinalysis negative for evidence of infection.   ASSESSMENT AND PLAN: An 81 year old Caucasian female with a history of hypertension; gastroesophageal reflux disease; chronic obstructive pulmonary disease; type 2 diabetes, insulin requiring; as well as diastolic congestive heart failure and bioprosthetic aortic valve; who presented with acute onset of right-sided weakness and slurred speech.  1.  Transient ischemic attack: Place on telemetry. Neurologic checks q. 4 hours. Aspirin and statin therapy. Check an MRI, Doppler, lipid panel. She apparently recently had a transthoracic echocardiogram;  however, do not have those results. We will consult her cardiologist, who should have those results. If it has not been recent, we will repeat now.  2.  Hypoglycemia: Will half her dose of her basal insulin for now, get q. 4 hour Accu-Cheks until blood sugar is stabilized. At that time can be spaced back out to before meals and at bedtime. We will also initiate insulin sliding scale.  3.  Exacerbation of diastolic congestive heart failure with stable vital signs: Increase diuresis. Will change from p.o. to intravenous Lasix. Follow urine output, renal function, ins and outs. Consult cardiology. Follows with Dr. Nehemiah Massed of [facility].  4.  Hypokalemia: Replace with goal 4 to 5. Check magnesium level.  5.  Elevated troponin, which appears to be chronic: We will trend those for now.  6.  Venous thromboembolism prophylaxis with heparin subcutaneous.  CODE STATUS: The patient is full code.   TIME SPENT: 45 minutes.    ____________________________ Aaron Mose. Hower, MD dkh:ST D: 04/03/2014 23:13:18 ET T: 04/04/2014 00:14:37 ET JOB#: [id number]  cc: Aaron Mose. Hower, MD, <Dictator> DAVID Woodfin Ganja MD ELECTRONICALLY SIGNED 04/04/2014 1:58

## 2014-07-30 NOTE — Consult Note (Signed)
   Present Illness An 81 year old Caucasian female with a history of hypertension, essential; gastroesophageal reflux disease without esophagitis; COPD, non-oxygen-dependent; type 2 diabetes, insulin-requiring; and diastolic congestive heart failure status post bioprosthetic aortic valve presenting with right-sided weakness. She underwent a head ct which showed no evidence of acute abnormality. Carotid doppler showed less than 50% stenosis. She has had improvement in her neurologic symptoms. Echo done today reveals preserved lv function with mildly stenotic aortic valve tiussue prosthesis with moderate to severe mr and tr. No evidnece of cardiac source of emboli in this study. Echo done in the office in 11/15 was similar. She is on asa and has mild anaphalaxis to plavix. She reports compliance with meds.   Physical Exam:  GEN no acute distress   HEENT PERRL   NECK supple   RESP clear BS   CARD Regular rate and rhythm  Murmur   Murmur Systolic   Systolic Murmur Out flow  axilla   ABD denies tenderness  normal BS   LYMPH negative neck, negative axillae   EXTR negative cyanosis/clubbing, positive edema   SKIN normal to palpation   NEURO cranial nerves intact, motor/sensory function intact   PSYCH A+O to time, place, person   Review of Systems:  Subjective/Chief Complaint right facial droop and dysarthria   General: No Complaints   Skin: No Complaints   ENT: No Complaints   Eyes: No Complaints   Respiratory: No Complaints   Cardiovascular: No Complaints   Gastrointestinal: No Complaints   Genitourinary: No Complaints   Vascular: No Complaints   Musculoskeletal: No Complaints   Neurologic: No Complaints   Hematologic: No Complaints   Endocrine: No Complaints   Psychiatric: No Complaints   Review of Systems: All other systems were reviewed and found to be negative   Medications/Allergies Reviewed Medications/Allergies reviewed   Family & Social History:   Family and Social History:  Family History Non-Contributory   Social History negative tobacco   EKG:  EKG NSR    Sulfa drugs: Swelling  Septra: Other  PCN: Other  Hydrocodone: Unknown  Tape: Rash   Impression 81 yo female with history of aortic vavle replacement with tissue prosthesis, history of mitral and tricuspid valve regurgitaiton who was admitted iwth dysarthria and right sided weakness. Her sympotms have improved and nearly resolved with no evidence of acute process on head ct. Carotid bopplers unremarkable. Awaiting brain mri. Ech oshows normal lv funciton with mod to severe mr and tr and mild as similar to previous echo. Etiolog of her tia symptoms unclear. No obvious cardiac source of emboli. Allergic to plavix. Will need to consider chronic anticaogulation. Await neurologic evaluation. Has hisotyr of remote pci with mild troponin elevaiton consistant with demand ischemic and valuvlar disease . Does not appear to represent acute coronary event.   Plan 1. Conitnue with asa 2. Proceed iwth brain mri as planned 3. Consider chronic anticoagulation 4. Consider neurology evalutaion 5. Continue amlodipine and lisinopril 6. Further recs pending course   Electronic Signatures: Teodoro Spray (MD)  (Signed 01-Jan-16 12:25)  Authored: General Aspect/Present Illness, History and Physical Exam, Review of System, Family & Social History, EKG , Allergies, Impression/Plan   Last Updated: 01-Jan-16 12:25 by Teodoro Spray (MD)

## 2014-08-03 NOTE — Discharge Summary (Signed)
Dates of Admission and Diagnosis:  Date of Admission 04-Apr-2014   Date of Discharge 07-Apr-2014   Admitting Diagnosis RUE weakness   Final Diagnosis 1. TIA with RUE weakness 2. Hypoglycemia 3. Labile DIABETES MELLITUS  4. hypertension  5. Chronic systolic chf 6. Severe MR    Chief Complaint/History of Present Illness PRIMARY CARE PHYSICIAN: Sharlet Salina C. Hall Busing, MD  CARDIOLOGIST: Corey Skains, MD, [facility].  CHIEF COMPLAINT: Right-sided weakness.   HISTORY OF PRESENT ILLNESS: An 81 year old Caucasian female with a history of hypertension, essential; gastroesophageal reflux disease without esophagitis; COPD, non-oxygen-dependent; type 2 diabetes, insulin-requiring; and diastolic congestive heart failure status post bioprosthetic aortic valve presenting with right-sided weakness. History obtained from the patient as well as husband, who is sitting at bedside. The patient was eating dinner, had acute onset of right hand weakness as well as slurred speech. They called EMS; upon arrival, noted blood glucose of 79 and still symptomatic. In the Emergency Department, she was hypoglycemic, blood glucose down to 47. Her symptoms have resolved in the Emergency Department; however, no one is quite sure whether this is before or after receiving dextrose. She also complains of increasing lower extremity edema for the last 1 to 2 weeks with orthopnea, dyspnea on exertion, and denies any chest pain or cough.   Allergies:  Sulfa drugs: Swelling  Septra: Other  PCN: Other  Hydrocodone: Unknown  Tape: Rash  Hepatic:  02-Jan-16 05:16   Bilirubin, Total 0.7  Alkaline Phosphatase 86 (46-116 NOTE: New Reference Range 10/22/13)  SGPT (ALT) 28 (14-63 NOTE: New Reference Range 10/22/13)  SGOT (AST)  38  Total Protein, Serum 6.5  Albumin, Serum  3.3  Routine Chem:  02-Jan-16 05:16   Glucose, Serum  254  BUN  22  Creatinine (comp) 0.95  Sodium, Serum 138  Potassium, Serum 3.8  Chloride,  Serum 102  CO2, Serum 32  Calcium (Total), Serum 8.7  Osmolality (calc) 288  eGFR (African American) >60  eGFR (Non-African American)  59 (eGFR values <41m/min/1.73 m2 may be an indication of chronic kidney disease (CKD). Calculated eGFR, using the MRDR Study equation, is useful in  patients with stable renal function. The eGFR calculation will not be reliable in acutely ill patients when serum creatinine is changing rapidly. It is not useful in patients on dialysis. The eGFR calculation may not be applicable to patients at the low and high extremes of body sizes, pregnant women, and vegetarians.)  Anion Gap  4  Routine Hem:  02-Jan-16 05:16   WBC (CBC) 4.0  RBC (CBC) 4.32  Hemoglobin (CBC) 12.3  Hematocrit (CBC) 37.3  Platelet Count (CBC)  119  MCV 86  MCH 28.4  MCHC 32.9  RDW  15.4  Neutrophil % 60.8  Lymphocyte % 21.1  Monocyte % 11.6  Eosinophil % 4.6  Basophil % 1.9  Neutrophil # 2.4  Lymphocyte #  0.8  Monocyte # 0.5  Eosinophil # 0.2  Basophil # 0.1 (Result(s) reported on 05 Apr 2014 at 05:53AM.)   PERTINENT RADIOLOGY STUDIES: LabUnknown:    01-Jan-16 13:34, MRI Brain Without Contrast  PACS Image   MRI:  MRI Brain Without Contrast   REASON FOR EXAM:    CVA  COMMENTS:       PROCEDURE: MR  - MR BRAIN WO CONTRAST  - Apr 04 2014  1:34PM     CLINICAL DATA:  Right-sided weakness and slurred speech for 1 day.    EXAM:  MRI HEAD WITHOUT CONTRAST  TECHNIQUE:  Multiplanar, multiecho pulse sequences of the brain and surrounding  structures were obtained without intravenous contrast.    COMPARISON:  CT head without contrast 04/03/2014. MRI brain  12/07/2007.    FINDINGS:  The diffusion-weighted images demonstrate no evidence for acuteor  subacute infarction. Marked cerebral atrophy and diffuse white  matter changes are noted bilaterally. A a right frontal lobe infarct  is new since 2009, but not acute. There is marked thinning of the  corpus callosum and  periventricular white matter.    Remote lacunar infarcts are present within the basal ganglia  bilaterally.    Flow is present in the major intracranial arteries.    The left lens replacement is noted. The globes and orbits are  otherwise intact. The paranasal sinuses are clear. There is some  fluid in the left mastoid air cells without an obstructing  nasopharyngeal lesion.     IMPRESSION:  1. No acute intracranial abnormality.  2. Advanced atrophy and diffuse white matter disease with marked  thinning of the white matter in the corpus callosum.  3. Anterior right frontal lobe infarct is new since 2009, but not  acute.      Electronically Signed    By: Lawrence Santiago M.D.    On: 04/04/2014 13:41     Verified By: Resa Miner. MATTERN, M.D.,   Pertinent Past History:  Pertinent Past History PAST MEDICAL HISTORY: Essential hypertension; gastroesophageal reflux disease without esophagitis; COPD, non-oxygen-dependent; type 2 diabetes requiring insulin; hypothyroidism; coronary artery disease status post PCI and stent placement x 3; bioprosthetic aortic valve; as well as hyperlipidemia, unspecified; and congestive heart failure, diastolic.   Hospital Course:  Hospital Course An 81 year old Caucasian female with a history of hypertension; gastroesophageal reflux disease; chronic obstructive pulmonary disease; type 2 diabetes, insulin requiring; as well as diastolic congestive heart failure and bioprosthetic aortic valve; who presented with acute onset of right-sided weakness and slurred speech.   * Transiant Ischemia Attack: negative MRI brain, dopplers neg for significant stenosis. checked fasting lipid profile, echocardiogram normal. Symptoms resolved.  * Labile diabetes mellitus:  Reduced Lantus and AC Lispro. BS improved. Still mildly elevated but no hypoglycemic episodes.  * Acute on chronic diastolic CHF- likely secondary to severe mitral regurgitation Follow urine output,  renal function, ins and outs. Consult cardiology. Follows with Dr. Nehemiah Massed of [facility]. Cards consulted, stable now.  * Coronary artery disease status post stent- cards follow up, mild troponin elevated, likly 2/2 chf and demand ischemia. no cp on plavix, statin and toprol and lisinopril  * High Blood Pressure (Hypertension) - metoprolol, norvasc, lasix and lisinopril  * Hypokalemia: Replace with goal 4 to 5. Check magnesium level.   Discussed with Dr. Ubaldo Glassing. No anti-coagulation due to fall risk. Continue ASA.  Time spent on discharge activity 40 minutes   Condition on Discharge Fair   Code Status:  Code Status Full Code   PHYSICAL EXAM ON DISCHARGE:  Physical Exam:  GEN no acute distress   HEENT pink conjunctivae   RESP normal resp effort  clear BS   CARD regular rate  murmur present   ABD soft   NEURO motor/sensory function intact   PSYCH alert, A+O to time, place, person   VITAL SIGNS:  Vital Signs: **Vital Signs.:   04-Jan-16 11:36  Vital Signs Type Routine  Temperature Temperature (F) 98.2  Celsius 36.7  Pulse Pulse 74  Respirations Respirations 19  Systolic BP Systolic BP 144  Diastolic BP (mmHg) Diastolic BP (mmHg)  64  Mean BP 83  Pulse Ox % Pulse Ox % 96  Pulse Ox Activity Level  At rest  Oxygen Delivery Room Air/ 21 %   DISCHARGE INSTRUCTIONS HOME MEDS:  Medication Reconciliation: Patient's Home Medications at Discharge:     Medication Instructions  levothyroxine 112 mcg (0.112 mg) tablet  1 tab(s) orally once a day    lisinopril 5 mg oral tablet  1 tab(s) orally once a day   metoprolol succinate 50 mg oral tablet, extended release  1 tab(s) orally once a day   spiriva 18 mcg capsule  1 each inhaled once a day   amlodipine 10 mg tablet  1 tab(s) orally once a day   alendronate 70 mg oral tablet  1 tab(s) orally once a week on Sunday   temazepam 15 mg oral capsule  1 cap(s) orally once a day (at bedtime)   nitrostat 0.4 mg sublingual  tablet  1 tab(s) sublingual every 5 minutes, As Needed- for Chest Pain    oxybutynin 5 mg oral tablet  1 tab(s) orally 2 times a day   aspirin enteric coated 81 mg oral delayed release tablet  1 tab(s) orally once a day   furosemide 40 mg tablet  1 tab(s) orally 2 times a day   nexium 40 mg oral delayed release capsule  1 cap(s) orally once a day   symbicort 160 mcg-4.5 mcg/inh inhalation aerosol  2 puff(s) inhaled 2 times a day   tamsulosin 0.4 mg oral capsule  1 cap(s) orally once a day   montelukast 10 mg oral tablet  1 tab(s) orally once a day (in the evening)   prosed/ds  1 tab(s) orally once a day, As Needed   insulin lispro 100 units/ml subcutaneous solution  3 unit(s) subcutaneous 3 times a day (with meals)   insulin glargine 100 units/ml subcutaneous solution  15 unit(s) subcutaneous once a day (at bedtime)   atorvastatin 20 mg oral tablet  1 tab(s) orally once a day    STOP TAKING THE FOLLOWING MEDICATION(S):    meloxicam 7.5 mg oral tablet: 1 tab(s) orally once a day  Physician's Instructions:  Diet Low Sodium  Carbohydrate Controlled (ADA) Diet   Activity Limitations As tolerated   Return to Work Not Applicable   Time frame for Follow Up Appointment 1-2 weeks  primary care provider   Electronic Signatures: Alba Destine (MD)  (Signed 04-Jan-16 14:15)  Authored: ADMISSION DATE AND DIAGNOSIS, CHIEF COMPLAINT/HPI, Allergies, PERTINENT LABS, PERTINENT RADIOLOGY STUDIES, PERTINENT PAST HISTORY, HOSPITAL COURSE, PHYSICAL EXAM ON DISCHARGE, VITAL SIGNS, DISCHARGE INSTRUCTIONS HOME MEDS, PATIENT INSTRUCTIONS   Last Updated: 04-Jan-16 14:15 by Alba Destine (MD)

## 2014-08-12 ENCOUNTER — Ambulatory Visit: Payer: Medicare Other | Admitting: General Surgery

## 2014-09-03 ENCOUNTER — Encounter: Payer: Self-pay | Admitting: *Deleted

## 2014-11-26 IMAGING — CR DG CHEST 2V
1 series · 3 of 3 positions shown · non-contrast
Comparison: none

REASON FOR EXAM: f/u CHF
COMMENTS:

[Series 1: ap · 0.17mm/px · 3 of 3 slices shown]
[im 1/3]
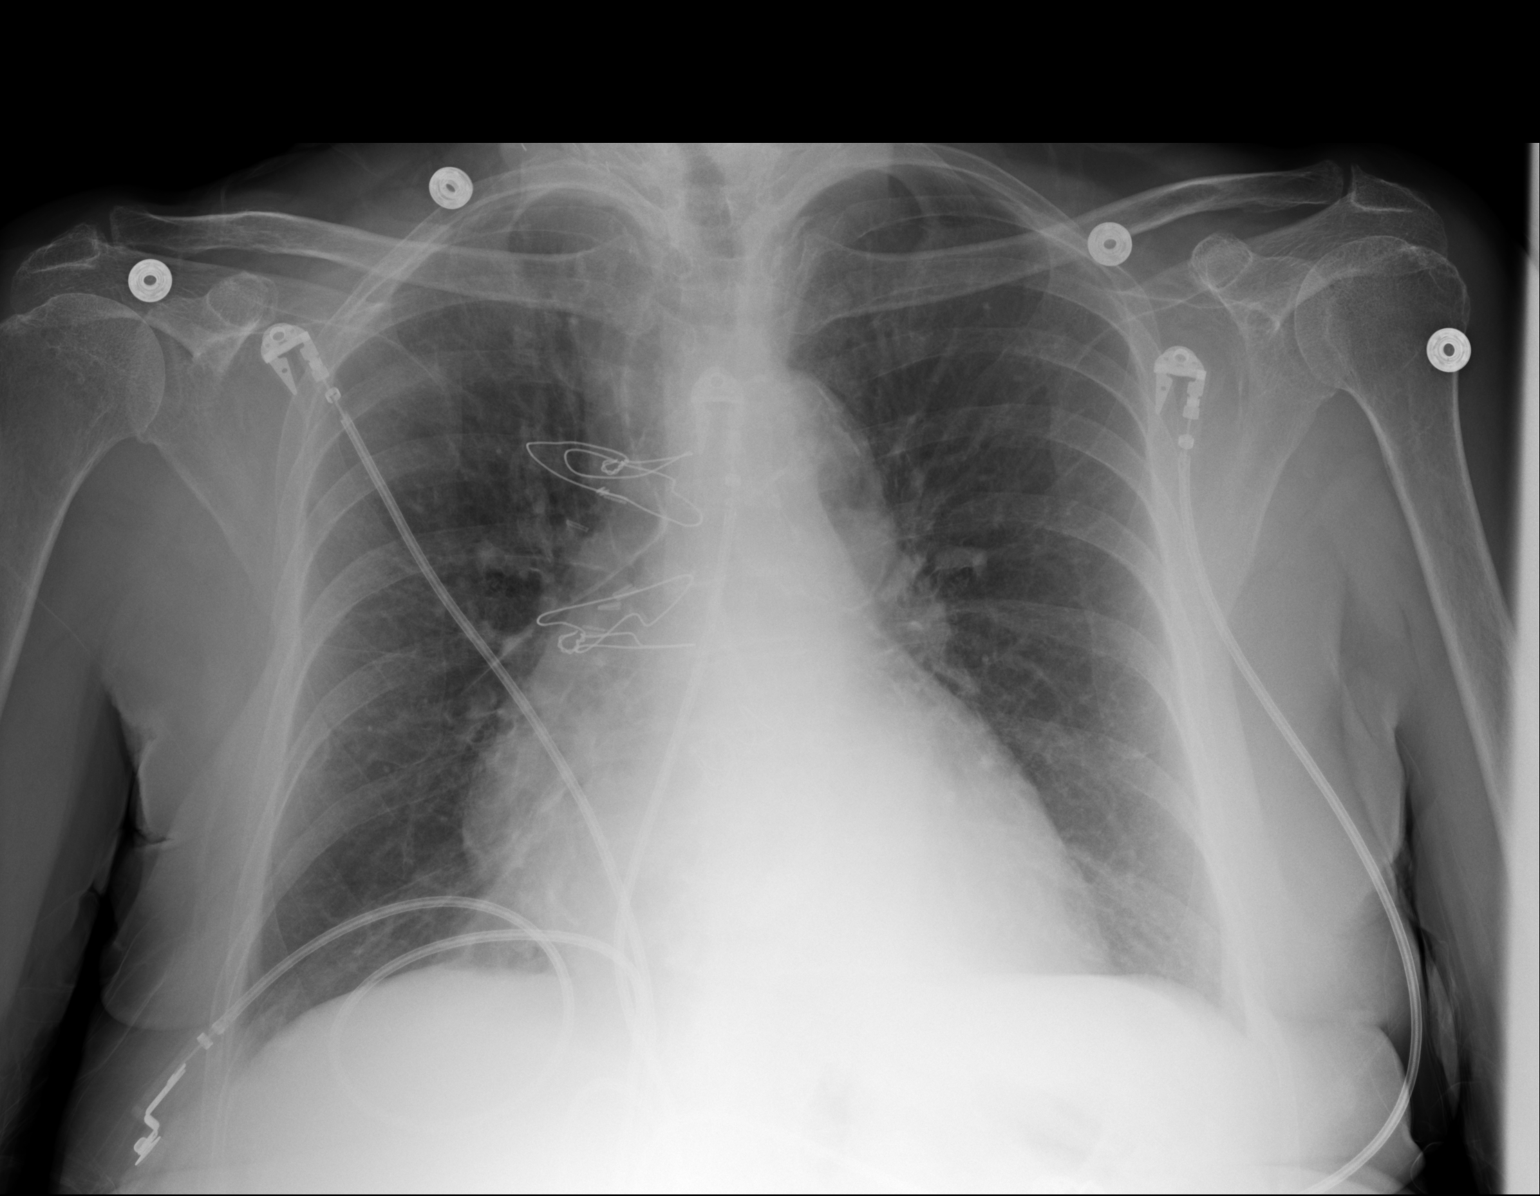
[im 2/3]
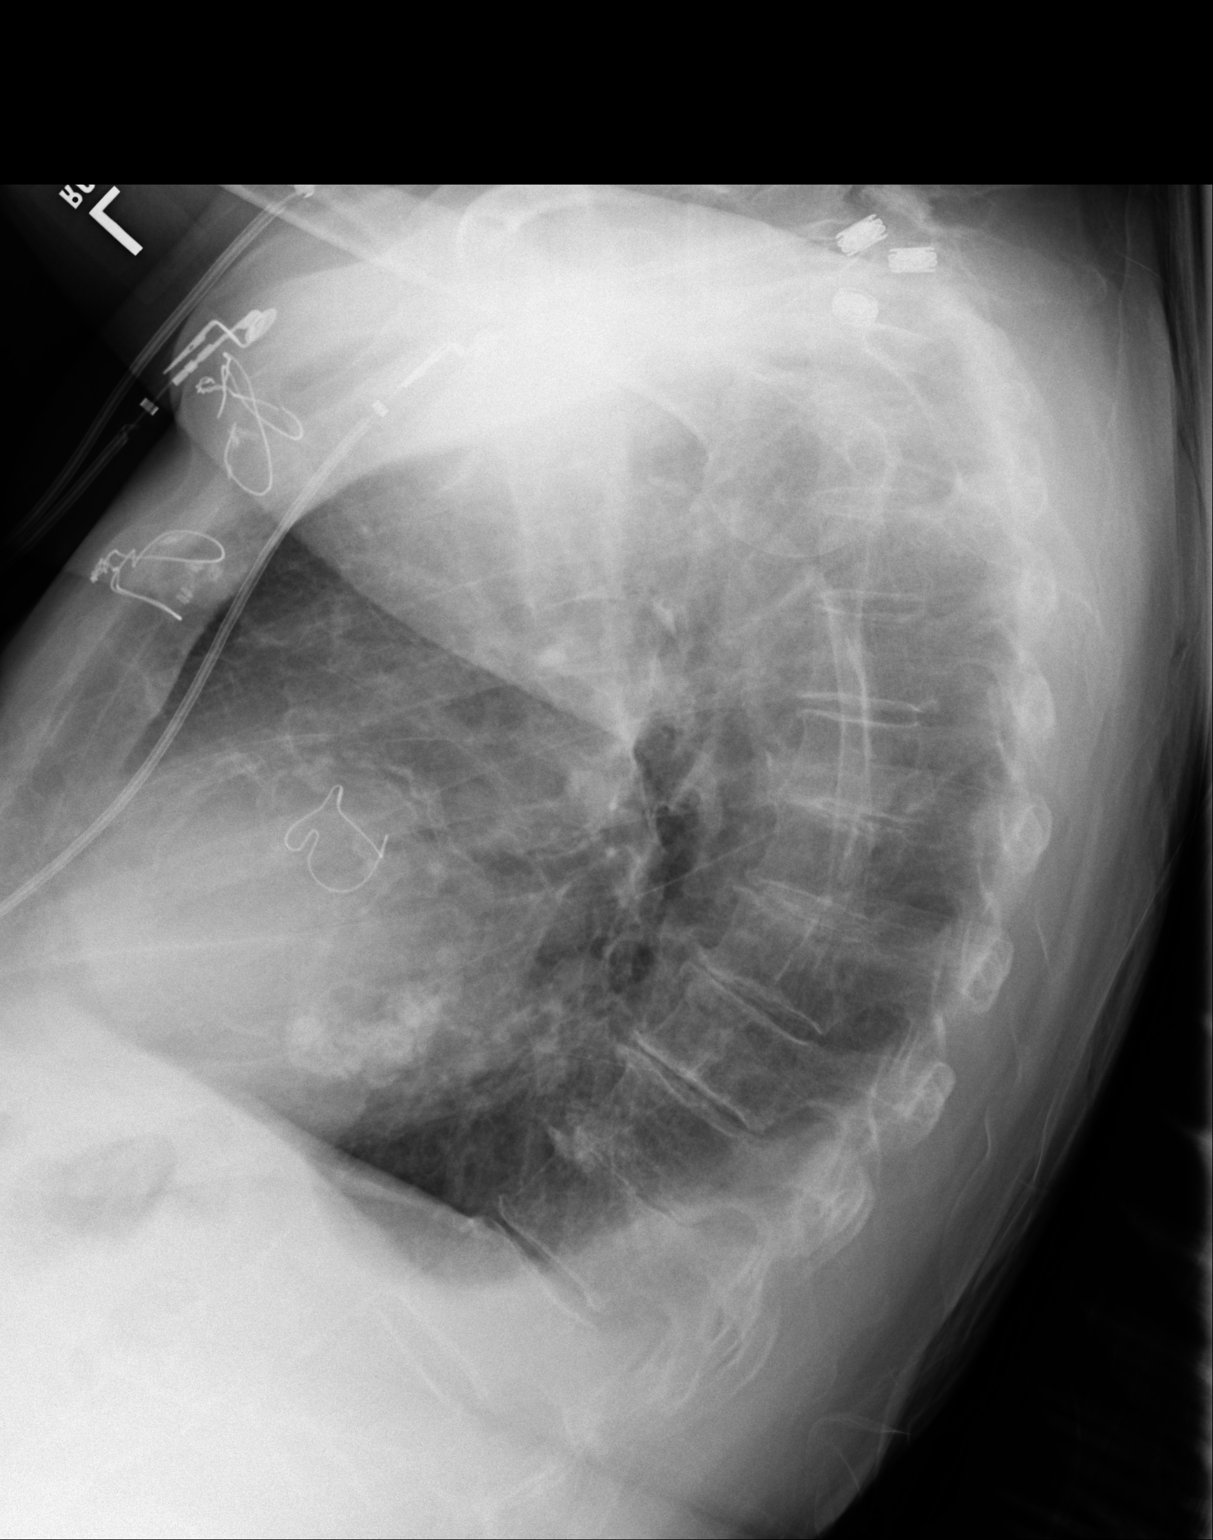
[im 3/3]
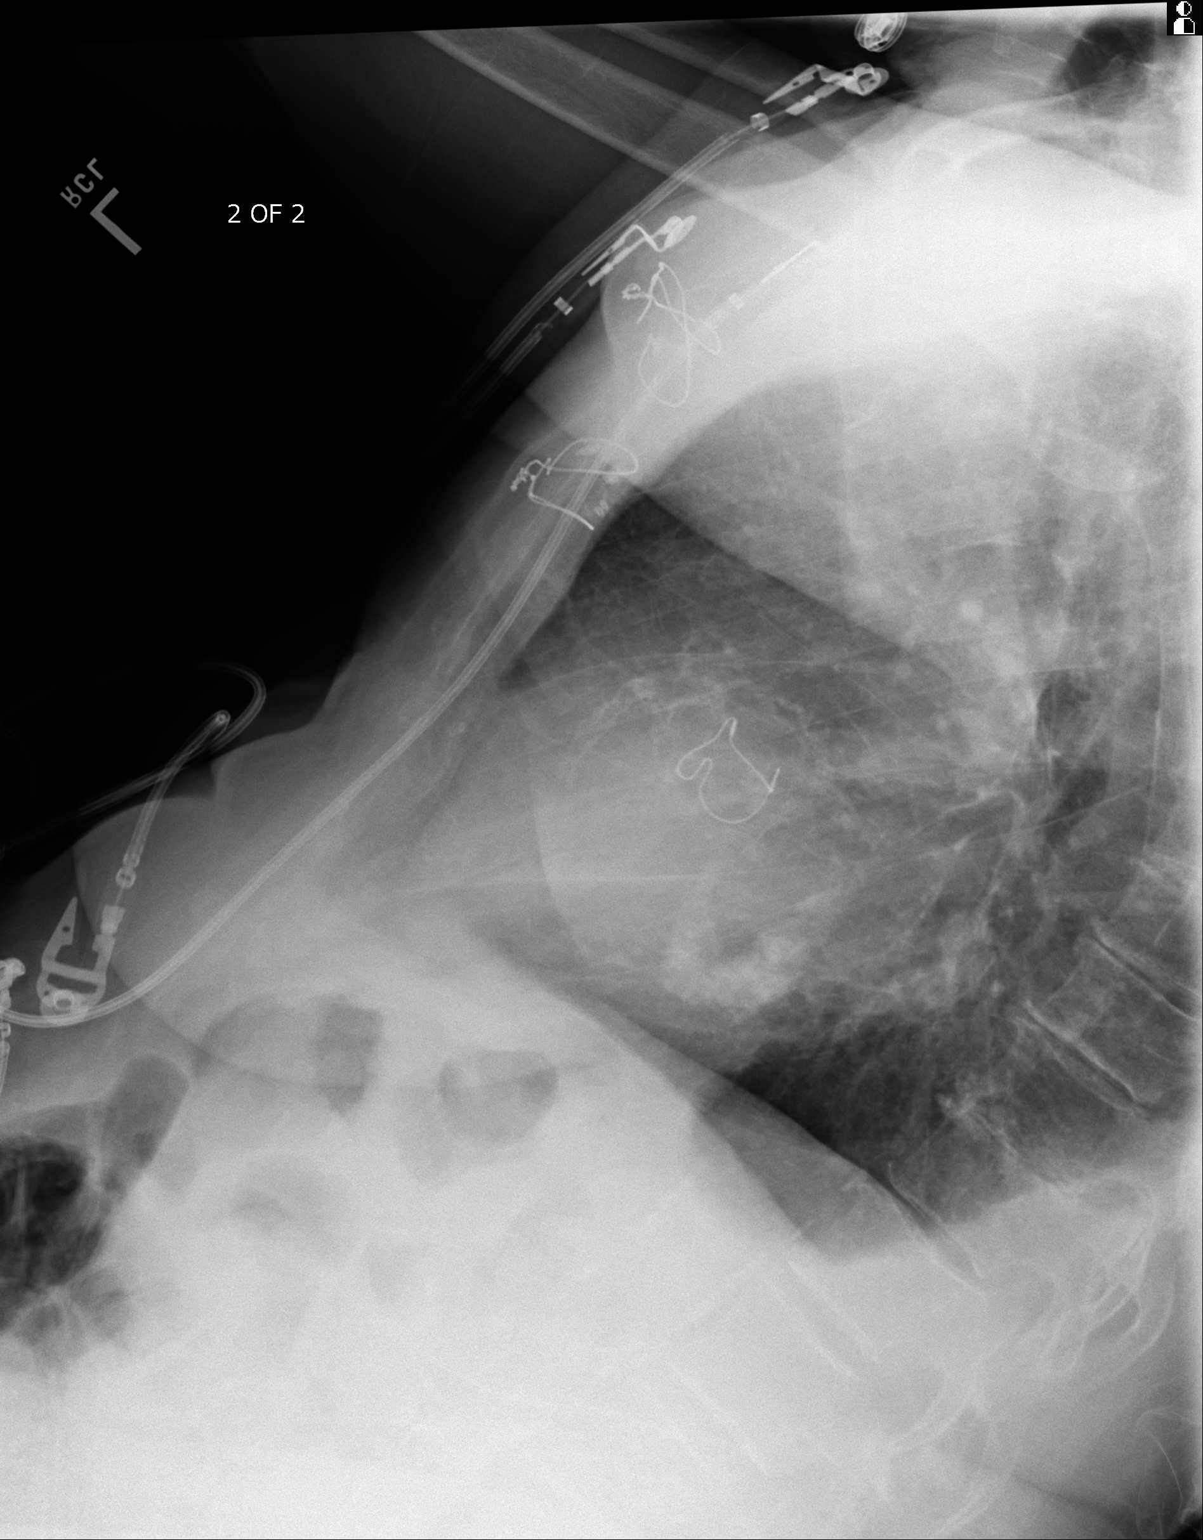

[3 of 3 positions shown; findings below may reference images not displayed]

PROCEDURE:     DXR - DXR CHEST PA (OR AP) AND LATERAL  - September 06, 2012  [DATE]

RESULT:     Comparison is made to study September 04, 2012.

The lungs remain mildly hyperinflated. Blunting of the posterior
costophrenic angle presumably on the right has become more conspicuous. The
cardiac silhouette remains enlarged. The pulmonary vascularity is not
clearly engorged. The bones are osteopenic. The mediastinum is normal in
width. The patient has undergone previous median sternotomy. There is and a
prosthetic valve in the aortic position. There is calcification of the
mitral valvular annulus.
IMPRESSION: There is a larger amount of pleural fluid on the right
though the overall volume remains low. There is no pulmonary vascular
congestion although the cardiac silhouette remains mildly enlarged. There is
no evidence of pneumonia.

[REDACTED]

## 2014-11-30 IMAGING — CR DG CHEST 1V PORT
1 series · 1 of 1 positions shown · non-contrast
Comparison: none

REASON FOR EXAM: check central line placement
COMMENTS:

[ap]
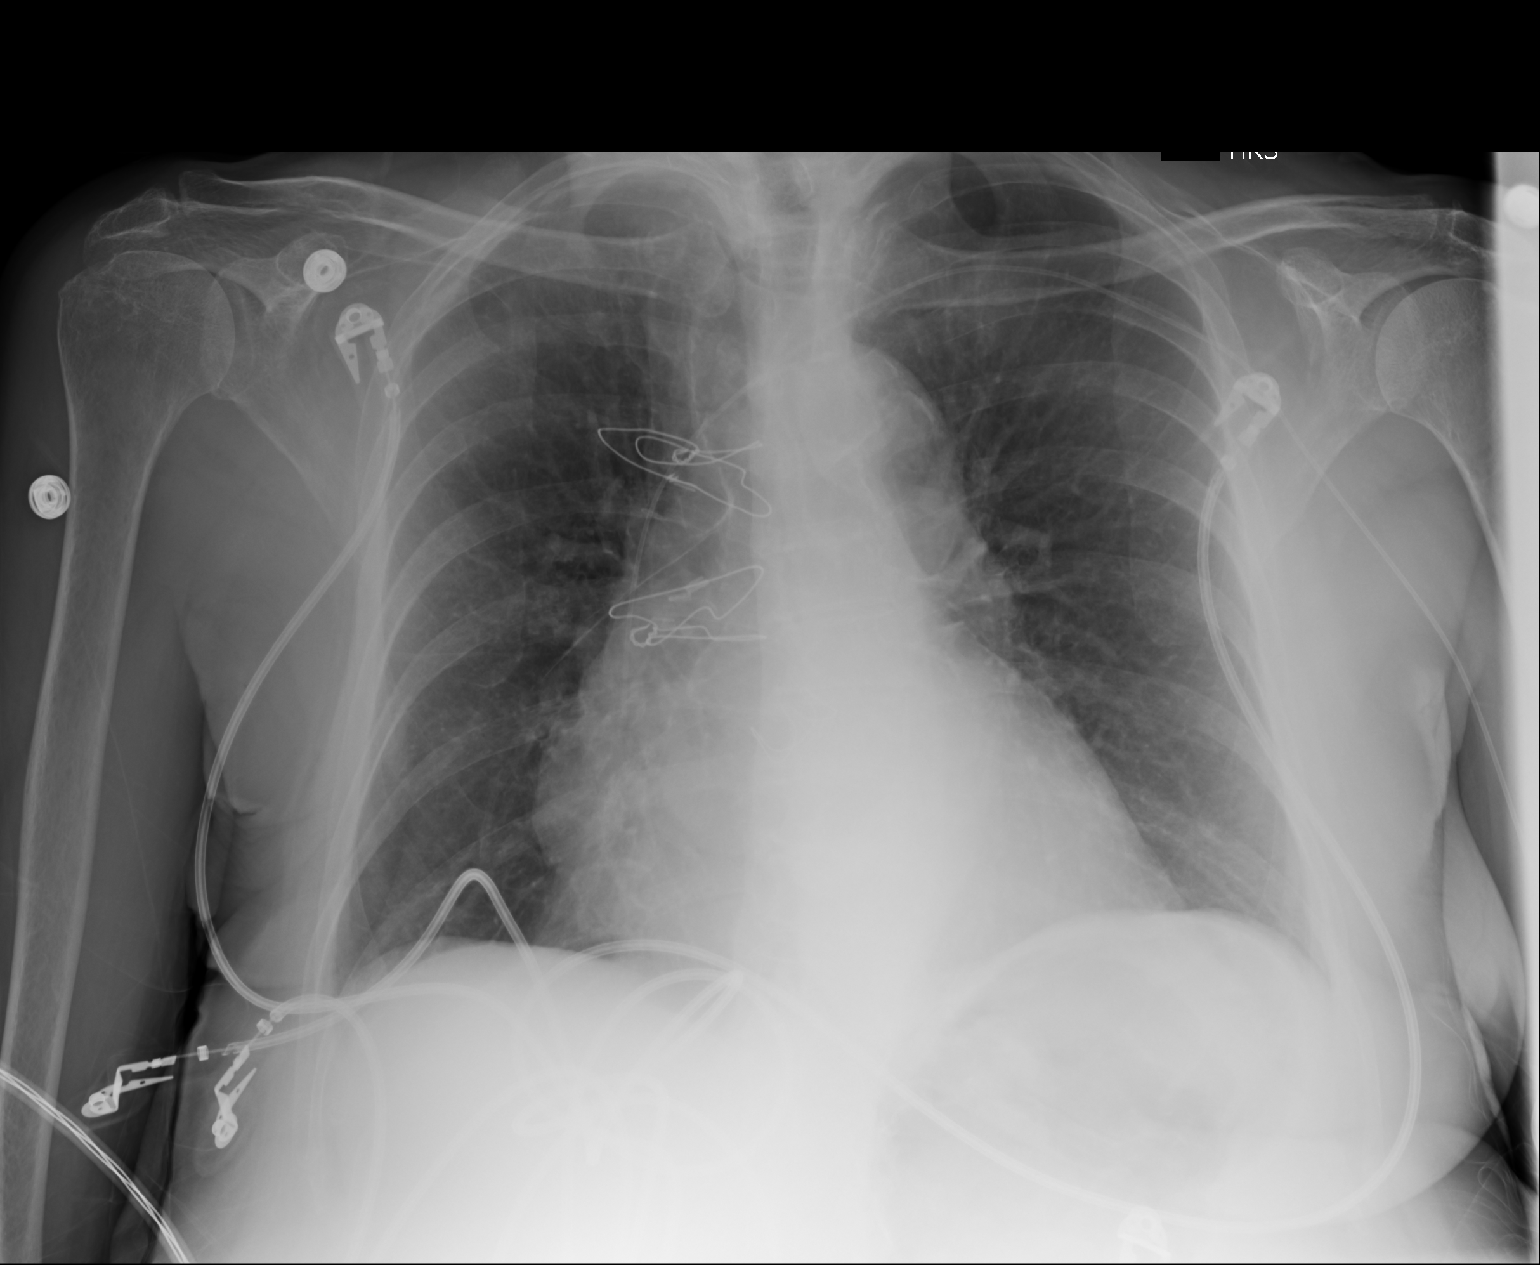

[1 of 1 positions shown; findings below may reference images not displayed]

PROCEDURE:     DXR - DXR PORTABLE CHEST SINGLE VIEW  - September 10, 2012  [DATE]

RESULT:     Comparison is made to a study September 06, 2012.

A PICC line has been placed via the left upper extremity. The tip of the
catheter lies in the region of the distal SVC. There is no post placement
complication demonstrated. The lungs are well-expanded. There is no focal
infiltrate. The cardiac silhouette is top normal in size. The pulmonary
vascularity is not engorged. There is mild tortuosity of the descending
thoracic aorta. The bony thorax is normal in appearance.
IMPRESSION: The patient has undergone successful PICC line placement.

[REDACTED]

## 2014-12-04 DEATH — deceased
